# Patient Record
Sex: Female | Born: 1985 | Race: White | Hispanic: No | Marital: Married | Smoking: Never smoker
Health system: Southern US, Community
[De-identification: ages and names within clinical notes are randomized; demographics above are authoritative.]

## PROBLEM LIST (undated history)

## (undated) DIAGNOSIS — A6000 Herpesviral infection of urogenital system, unspecified: Secondary | ICD-10-CM

## (undated) DIAGNOSIS — N83209 Unspecified ovarian cyst, unspecified side: Secondary | ICD-10-CM

## (undated) DIAGNOSIS — R519 Headache, unspecified: Secondary | ICD-10-CM

## (undated) DIAGNOSIS — S42402A Unspecified fracture of lower end of left humerus, initial encounter for closed fracture: Secondary | ICD-10-CM

## (undated) DIAGNOSIS — G8929 Other chronic pain: Secondary | ICD-10-CM

## (undated) DIAGNOSIS — F329 Major depressive disorder, single episode, unspecified: Secondary | ICD-10-CM

## (undated) DIAGNOSIS — R51 Headache: Secondary | ICD-10-CM

## (undated) DIAGNOSIS — F32A Depression, unspecified: Secondary | ICD-10-CM

## (undated) HISTORY — DX: Other chronic pain: G89.29

## (undated) HISTORY — DX: Unspecified fracture of lower end of left humerus, initial encounter for closed fracture: S42.402A

## (undated) HISTORY — DX: Major depressive disorder, single episode, unspecified: F32.9

## (undated) HISTORY — DX: Unspecified ovarian cyst, unspecified side: N83.209

## (undated) HISTORY — DX: Herpesviral infection of urogenital system, unspecified: A60.00

## (undated) HISTORY — DX: Headache: R51

## (undated) HISTORY — DX: Depression, unspecified: F32.A

## (undated) HISTORY — DX: Headache, unspecified: R51.9

## (undated) HISTORY — PX: GALLBLADDER SURGERY: SHX652

---
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
## 2006-12-25 ENCOUNTER — Observation Stay: Payer: Self-pay | Admitting: Obstetrics and Gynecology

## 2006-12-25 ENCOUNTER — Inpatient Hospital Stay: Payer: Self-pay | Admitting: Obstetrics and Gynecology

## 2007-11-18 ENCOUNTER — Emergency Department: Payer: Self-pay | Admitting: Emergency Medicine

## 2010-02-12 DIAGNOSIS — A6000 Herpesviral infection of urogenital system, unspecified: Secondary | ICD-10-CM

## 2010-02-12 HISTORY — DX: Herpesviral infection of urogenital system, unspecified: A60.00

## 2010-02-12 HISTORY — PX: DIAGNOSTIC LAPAROSCOPY: SUR761

## 2011-02-23 ENCOUNTER — Ambulatory Visit: Payer: Self-pay | Admitting: Obstetrics & Gynecology

## 2011-03-22 ENCOUNTER — Ambulatory Visit: Payer: Self-pay | Admitting: Obstetrics & Gynecology

## 2011-03-22 LAB — CBC
HCT: 41.1 % (ref 35.0–47.0)
HGB: 13.7 g/dL (ref 12.0–16.0)
Platelet: 210 10*3/uL (ref 150–440)
RBC: 4.52 10*6/uL (ref 3.80–5.20)
WBC: 12.1 10*3/uL — ABNORMAL HIGH (ref 3.6–11.0)

## 2011-04-06 ENCOUNTER — Ambulatory Visit: Payer: Self-pay | Admitting: Obstetrics & Gynecology

## 2011-04-06 LAB — PREGNANCY, URINE: Pregnancy Test, Urine: NEGATIVE m[IU]/mL

## 2012-08-03 ENCOUNTER — Inpatient Hospital Stay: Payer: Self-pay | Admitting: Obstetrics and Gynecology

## 2012-08-03 LAB — CBC WITH DIFFERENTIAL/PLATELET
Eosinophil %: 1 %
HCT: 35.5 % (ref 35.0–47.0)
HGB: 12.1 g/dL (ref 12.0–16.0)
Lymphocyte #: 0.9 10*3/uL — ABNORMAL LOW (ref 1.0–3.6)
Lymphocyte %: 7.5 %
MCH: 30.6 pg (ref 26.0–34.0)
MCHC: 34 g/dL (ref 32.0–36.0)
Monocyte #: 0.8 x10 3/mm (ref 0.2–0.9)
Monocyte %: 6.8 %
Neutrophil %: 84.7 %
Platelet: 182 10*3/uL (ref 150–440)
RBC: 3.95 10*6/uL (ref 3.80–5.20)
RDW: 13.1 % (ref 11.5–14.5)
WBC: 12.2 10*3/uL — ABNORMAL HIGH (ref 3.6–11.0)

## 2014-06-06 NOTE — Op Note (Signed)
PATIENT NAME:  Judy HammersmithMASHBURN, [name] MR#:  [id number] DATE OF BIRTH:  1985-06-10  DATE OF PROCEDURE:  04/06/2011  PREOPERATIVE DIAGNOSIS:  Pelvic pain and complication arising from IUD placement.  POSTOPERATIVE DIAGNOSIS:  Pelvic pain and complication arising from IUD placement.  PROCEDURE: Laparoscopy with removal of IUD.   SURGEON: Annamarie MajorPaul Yazmeen Woolf, M.D.   ANESTHESIA: General.   ESTIMATED BLOOD LOSS: Minimal.   COMPLICATIONS: None.   FINDINGS: Normal pelvic anatomy. IUD was located in the fat surrounding the lower sigmoid.   DISPOSITION: To the recovery room in stable condition.   TECHNIQUE: The patient is prepped and draped in the usual sterile fashion after adequate anesthesia is obtained in the dorsal lithotomy position. Bladder is drained with a Robinson catheter and a Hulka tenaculum is placed on the uterus for manipulation purposes.   Attention is then turned to the abdomen where a Veress needle is inserted through a 5-mm infraumbilical incision after Marcaine is used to anesthetize the skin. Veress needle placement is confirmed using the hanging drop technique and the abdomen is then insufflated with CO2 gas. A 5-mm trocar is then inserted under direct visualization with the laparoscope with no injuries or bleeding noted. The patient is placed in Trendelenburg positioning. The right lower quadrant incision is made with a scalpel after Marcaine is used to anesthetize the skin. A 5-mm trocar is placed with no injuries or bleeding noted. Laparoscopic instrumentation is used until the IUD is located in the tissue surrounding the lower sigmoid colon. There was no perforation in the bowel or colon or into the sidewall near the ureter. The IUD is grasped with a grasping instrument laparoscopically and is easily tented upwards until it was freed from the tissues. There was no bleeding noted. The IUD is removed completely. Gas is expelled. Trocars are removed. The skin is closed with Dermabond.  Hulka tenaculum and the catheter are also removed. The patient goes to the recovery room in stable condition. All sponge, instrument, and needle counts are correct.     ____________________________ R. Annamarie MajorPaul Alexzavier Girardin, MD rph:bjt D: 04/06/2011 14:34:14 ET T: 04/06/2011 16:03:15 ET JOB#: [id number]  cc: Dierdre Searles. Paul Hoover Grewe, MD, <Dictator> Nadara MustardOBERT P Remy Dia MD ELECTRONICALLY SIGNED 04/06/2011 [id number]:[id number]

## 2014-09-13 DIAGNOSIS — S42402A Unspecified fracture of lower end of left humerus, initial encounter for closed fracture: Secondary | ICD-10-CM

## 2014-09-13 HISTORY — DX: Unspecified fracture of lower end of left humerus, initial encounter for closed fracture: S42.402A

## 2014-10-31 ENCOUNTER — Emergency Department
Admission: EM | Admit: 2014-10-31 | Discharge: 2014-10-31 | Disposition: A | Discharge status: Home / Self Care | Payer: Self-pay | Attending: Emergency Medicine | Admitting: Emergency Medicine

## 2014-10-31 ENCOUNTER — Emergency Department: Payer: Self-pay

## 2014-10-31 ENCOUNTER — Encounter: Payer: Self-pay | Admitting: Emergency Medicine

## 2014-10-31 DIAGNOSIS — Y9389 Activity, other specified: Secondary | ICD-10-CM | POA: Insufficient documentation

## 2014-10-31 DIAGNOSIS — S42432A Displaced fracture (avulsion) of lateral epicondyle of left humerus, initial encounter for closed fracture: Secondary | ICD-10-CM | POA: Insufficient documentation

## 2014-10-31 DIAGNOSIS — W1839XA Other fall on same level, initial encounter: Secondary | ICD-10-CM | POA: Insufficient documentation

## 2014-10-31 DIAGNOSIS — Y998 Other external cause status: Secondary | ICD-10-CM | POA: Insufficient documentation

## 2014-10-31 DIAGNOSIS — S42433A Displaced fracture (avulsion) of lateral epicondyle of unspecified humerus, initial encounter for closed fracture: Secondary | ICD-10-CM

## 2014-10-31 DIAGNOSIS — Y9289 Other specified places as the place of occurrence of the external cause: Secondary | ICD-10-CM | POA: Insufficient documentation

## 2014-10-31 MED ORDER — NAPROXEN 500 MG PO TABS: 500.0000 mg | ORAL_TABLET | Freq: Two times a day (BID) | ORAL | Status: DC

## 2014-10-31 MED ORDER — HYDROCODONE-ACETAMINOPHEN 5-325 MG PO TABS: 1.0000 | ORAL_TABLET | ORAL | Status: DC | PRN

## 2014-10-31 NOTE — ED Notes (Signed)
Fell on Friday pm  Injury to left elbow

## 2014-10-31 NOTE — ED Provider Notes (Signed)
[facility] Department Provider Note ____________________________________________  Time seen: Approximately 9:03 AM  I have reviewed the triage vital signs and the nursing notes.   HISTORY  Chief Complaint Fall   HPI Judy Lozano is a 29 y.o. female here complaining of left elbow pain. Patient states she fell Friday night while "playing around". She denies any head injury or loss of consciousness. She states that at the time she hit her elbow falling back that thought that it would continue to get better. Since that time it is swollen more and become more painful. Today she has decreased range of motion secondary to increased pain. She states the pain is improved if holding it still. She has not taken any over-the-counter medication for her pain. She rates her pain a 6 out of 10. She denies any previous injuries to her elbow.   History reviewed. No pertinent past medical history.  There are no active problems to display for this patient.   History reviewed. No pertinent past surgical history.  Current Outpatient Rx  Name  Route  Sig  Dispense  Refill  . HYDROcodone-acetaminophen (NORCO/VICODIN) 5-325 MG per tablet   Oral   Take 1 tablet by mouth every 4 (four) hours as needed for moderate pain.   20 tablet   0   . naproxen (NAPROSYN) 500 MG tablet   Oral   Take 1 tablet (500 mg total) by mouth 2 (two) times daily with a meal.   30 tablet   0     Allergies Review of patient's allergies indicates no known allergies.  No family history on file.  Social History Social History  Substance Use Topics  . Smoking status: Never Smoker   . Smokeless tobacco: None  . Alcohol Use: Yes    Review of Systems Constitutional: No fever/chills Eyes: No visual changes. ENT: No sore throat. Cardiovascular: Denies chest pain. Respiratory: Denies shortness of breath. Gastrointestinal:   No nausea, no vomiting.   Musculoskeletal: Negative for  back pain. Positive for left elbow pain Skin: Negative for rash. Neurological: Negative for headaches, focal weakness or numbness.  10-point ROS otherwise negative.  ____________________________________________   PHYSICAL EXAM:  VITAL SIGNS: ED Triage Vitals  Enc Vitals Group     BP --      Pulse --      Resp --      Temp --      Temp src --      SpO2 --      Weight --      Height --      Head Cir --      Peak Flow --      Pain Score --      Pain Loc --      Pain Edu? --      Excl. in GC? --     Constitutional: Alert and oriented. Well appearing and in no acute distress. Eyes: Conjunctivae are normal. PERRL. EOMI. Head: Atraumatic. Nose: No congestion/rhinnorhea. Neck: No stridor.   Cardiovascular: Normal rate, regular rhythm. Grossly normal heart sounds.  Good peripheral circulation. Respiratory: Normal respiratory effort.  No retractions. Lungs CTAB. Gastrointestinal: Soft and nontender. No distention. Musculoskeletal: Left elbow no gross deformity however there is moderate soft tissue edema present. Range of motion is restricted secondary to pain. Patient is limited in rotation and tension. No lower extremity tenderness nor edema.  No joint effusions. Neurologic:  Normal speech and language. No gross focal neurologic deficits are  appreciated. No gait instability. Skin:  Skin is warm, dry and intact. No rash noted. No ecchymosis or abrasions were noted on the elbow. Psychiatric: Mood and affect are normal. Speech and behavior are normal.  ____________________________________________   LABS (all labs ordered are listed, but only abnormal results are displayed)  Labs Reviewed - No data to display _ RADIOLOGY  X-ray of the left elbow per radiologist shows moderate joint effusion with positive posterior fat pad. There is a tiny avulsion fracture adjacent to the lateral humeral epicondyle. ____________________________________________   PROCEDURES  Procedure(s)  performed: None  Critical Care performed: No  ____________________________________________   INITIAL IMPRESSION / ASSESSMENT AND PLAN / ED COURSE  Pertinent labs & imaging results that were available during my care of the patient were reviewed by me and considered in my medical decision making (see chart for details).  Patient was informed of the results of her x-ray. She was placed in a posterior OCL splint and sling. She was given naproxen for inflammation and Norco as needed for pain. She is to ice and elevate to reduce swelling. She is to follow-up with Dr. Ernest Pine who is on-call for orthopedics. ____________________________________________   FINAL CLINICAL IMPRESSION(S) / ED DIAGNOSES  Final diagnoses:  Avulsion fracture of lateral epicondyle of humerus      Tommi Rumps, PA-C 10/31/14 1055  Darien Ramus, MD 10/31/14 [phone]

## 2014-11-28 ENCOUNTER — Emergency Department
Admission: EM | Admit: 2014-11-28 | Discharge: 2014-11-28 | Disposition: A | Discharge status: Home / Self Care | Payer: Self-pay | Attending: Emergency Medicine | Admitting: Emergency Medicine

## 2014-11-28 ENCOUNTER — Encounter: Payer: Self-pay | Admitting: Emergency Medicine

## 2014-11-28 DIAGNOSIS — Y9289 Other specified places as the place of occurrence of the external cause: Secondary | ICD-10-CM | POA: Insufficient documentation

## 2014-11-28 DIAGNOSIS — Z791 Long term (current) use of non-steroidal anti-inflammatories (NSAID): Secondary | ICD-10-CM | POA: Insufficient documentation

## 2014-11-28 DIAGNOSIS — Y998 Other external cause status: Secondary | ICD-10-CM | POA: Insufficient documentation

## 2014-11-28 DIAGNOSIS — Y9389 Activity, other specified: Secondary | ICD-10-CM | POA: Insufficient documentation

## 2014-11-28 DIAGNOSIS — Z23 Encounter for immunization: Secondary | ICD-10-CM | POA: Insufficient documentation

## 2014-11-28 DIAGNOSIS — S91312A Laceration without foreign body, left foot, initial encounter: Secondary | ICD-10-CM

## 2014-11-28 DIAGNOSIS — W2209XA Striking against other stationary object, initial encounter: Secondary | ICD-10-CM | POA: Insufficient documentation

## 2014-11-28 MED ORDER — LIDOCAINE-EPINEPHRINE (PF) 1 %-1:200000 IJ SOLN
INTRAMUSCULAR | Status: AC
  Administered 2014-11-28: 30 mL
  Filled 2014-11-28: qty 30

## 2014-11-28 MED ORDER — LIDOCAINE-EPINEPHRINE 2 %-1:100000 IJ SOLN
30.0000 mL | Freq: Once | INTRAMUSCULAR | Status: DC
  Filled 2014-11-28: qty 30

## 2014-11-28 MED ORDER — CEPHALEXIN 500 MG PO CAPS: 500.0000 mg | ORAL_CAPSULE | Freq: Two times a day (BID) | ORAL | Status: DC

## 2014-11-28 MED ORDER — TETANUS-DIPHTHERIA TOXOIDS TD 5-2 LFU IM INJ
0.5000 mL | INJECTION | Freq: Once | INTRAMUSCULAR | Status: AC
  Administered 2014-11-28: 0.5 mL via INTRAMUSCULAR
  Filled 2014-11-28: qty 0.5

## 2014-11-28 NOTE — ED Notes (Signed)
Pt has laceration on her left heal from hitting it with screen door.

## 2014-11-28 NOTE — Discharge Instructions (Signed)
Laceration Care, Adult  A laceration is a cut that goes through all layers of the skin. The cut also goes into the tissue that is right under the skin. Some cuts heal on their own. Others need to be closed with stitches (sutures), staples, skin adhesive strips, or wound glue. Taking care of your cut lowers your risk of infection and helps your cut to heal better.  HOW TO TAKE CARE OF YOUR CUT  For stitches or staples:  · Keep the wound clean and dry.  · If you were given a bandage (dressing), you should change it at least one time per day or as told by your doctor. You should also change it if it gets wet or dirty.  · Keep the wound completely dry for the first 24 hours or as told by your doctor. After that time, you may take a shower or a bath. However, make sure that the wound is not soaked in water until after the stitches or staples have been removed.  · Clean the wound one time each day or as told by your doctor:    Wash the wound with soap and water.    Rinse the wound with water until all of the soap comes off.    Pat the wound dry with a clean towel. Do not rub the wound.  · After you clean the wound, put a thin layer of antibiotic ointment on it as told by your doctor. This ointment:    Helps to prevent infection.    Keeps the bandage from sticking to the wound.  · Have your stitches or staples removed as told by your doctor.  If your doctor used skin adhesive strips:   · Keep the wound clean and dry.  · If you were given a bandage, you should change it at least one time per day or as told by your doctor. You should also change it if it gets dirty or wet.  · Do not get the skin adhesive strips wet. You can take a shower or a bath, but be careful to keep the wound dry.  · If the wound gets wet, pat it dry with a clean towel. Do not rub the wound.  · Skin adhesive strips fall off on their own. You can trim the strips as the wound heals. Do not remove any strips that are still stuck to the wound. They will  fall off after a while.  If your doctor used wound glue:  · Try to keep your wound dry, but you may briefly wet it in the shower or bath. Do not soak the wound in water, such as by swimming.  · After you take a shower or a bath, gently pat the wound dry with a clean towel. Do not rub the wound.  · Do not do any activities that will make you really sweaty until the skin glue has fallen off on its own.  · Do not apply liquid, cream, or ointment medicine to your wound while the skin glue is still on.  · If you were given a bandage, you should change it at least one time per day or as told by your doctor. You should also change it if it gets dirty or wet.  · If a bandage is placed over the wound, do not let the tape for the bandage touch the skin glue.  · Do not pick at the glue. The skin glue usually stays on for 5-10 days. Then, it   falls off of the skin.  General Instructions   · To help prevent scarring, make sure to cover your wound with sunscreen whenever you are outside after stitches are removed, after adhesive strips are removed, or when wound glue stays in place and the wound is healed. Make sure to wear a sunscreen of at least 30 SPF.  · Take over-the-counter and prescription medicines only as told by your doctor.  · If you were given antibiotic medicine or ointment, take or apply it as told by your doctor. Do not stop using the antibiotic even if your wound is getting better.  · Do not scratch or pick at the wound.  · Keep all follow-up visits as told by your doctor. This is important.  · Check your wound every day for signs of infection. Watch for:    Redness, swelling, or pain.    Fluid, blood, or pus.  · Raise (elevate) the injured area above the level of your heart while you are sitting or lying down, if possible.  GET HELP IF:  · You got a tetanus shot and you have any of these problems at the injection site:    Swelling.    Very bad pain.    Redness.    Bleeding.  · You have a fever.  · A wound that was  closed breaks open.  · You notice a bad smell coming from your wound or your bandage.  · You notice something coming out of the wound, such as wood or glass.  · Medicine does not help your pain.  · You have more redness, swelling, or pain at the site of your wound.  · You have fluid, blood, or pus coming from your wound.  · You notice a change in the color of your skin near your wound.  · You need to change the bandage often because fluid, blood, or pus is coming from the wound.  · You start to have a new rash.  · You start to have numbness around the wound.  GET HELP RIGHT AWAY IF:  · You have very bad swelling around the wound.  · Your pain suddenly gets worse and is very bad.  · You notice painful lumps near the wound or on skin that is anywhere on your body.  · You have a red streak going away from your wound.  · The wound is on your hand or foot and you cannot move a finger or toe like you usually can.  · The wound is on your hand or foot and you notice that your fingers or toes look pale or bluish.     This information is not intended to replace advice given to you by your health care provider. Make sure you discuss any questions you have with your health care provider.     Document Released: 07/18/2007 Document Revised: 06/15/2014 Document Reviewed: 01/25/2014  Elsevier Interactive Patient Education ©2016 [employer]

## 2014-11-28 NOTE — ED Provider Notes (Signed)
[facility] Emergency Department Provider Note  ____________________________________________  Time seen: On arrival  I have reviewed the triage vital signs and the nursing notes.   HISTORY  Chief Complaint Laceration    HPI KEYLIN FERRYMAN is a 29 y.o. female who presents with complaints of a laceration to the left heel. She notes that approximately 13 hours ago screen door closed on her heel and she suffered a laceration. She remains able to move her foot including flexion and extension. She not come last night because she did not think it was that bad.    History reviewed. No pertinent past medical history.  There are no active problems to display for this patient.   Past Surgical History  Procedure Laterality Date  . Gallbladder surgery      Current Outpatient Rx  Name  Route  Sig  Dispense  Refill  . cephALEXin (KEFLEX) 500 MG capsule   Oral   Take 1 capsule (500 mg total) by mouth 2 (two) times daily.   14 capsule   0   . HYDROcodone-acetaminophen (NORCO/VICODIN) 5-325 MG per tablet   Oral   Take 1 tablet by mouth every 4 (four) hours as needed for moderate pain.   20 tablet   0   . naproxen (NAPROSYN) 500 MG tablet   Oral   Take 1 tablet (500 mg total) by mouth 2 (two) times daily with a meal.   30 tablet   0     Allergies Review of patient's allergies indicates no known allergies.  No family history on file.  Social History Social History  Substance Use Topics  . Smoking status: Never Smoker   . Smokeless tobacco: None  . Alcohol Use: Yes     Comment: Occasional    Review of Systems  Constitutional: Negative for fever.   Genitourinary: Negative for dysuria. Musculoskeletal: Negative for back pain. Skin: Negative for rash. Positive for laceration Neurological: Negative for headaches or focal weakness   ____________________________________________   PHYSICAL EXAM:  VITAL SIGNS: ED Triage Vitals  Enc  Vitals Group     BP 11/28/14 1118 139/63 mmHg     Pulse Rate 11/28/14 1118 82     Resp --      Temp 11/28/14 1118 98.3 F (36.8 C)     Temp Source 11/28/14 1118 Oral     SpO2 11/28/14 1118 99 %     Weight 11/28/14 1118 160 lb (72.576 kg)     Height 11/28/14 1118  (1.651 m)     Head Cir --      Peak Flow --      Pain Score 11/28/14 1124 6     Pain Loc --      Pain Edu? --      Excl. in GC? --      Constitutional: Alert and oriented. Well appearing and in no distress. Eyes: Conjunctivae are normal.  ENT   Head: Normocephalic and atraumatic.   Mouth/Throat: Mucous membranes are moist. Cardiovascular: Normal rate, regular rhythm.  Respiratory: Normal respiratory effort without tachypnea nor retractions.  Gastrointestinal: Soft and non-tender in all quadrants. No distention. There is no CVA tenderness. Musculoskeletal: Nontender with normal range of motion in all extremities. Achilles tendon function is normal, no evidence of laceration Neurologic:  Normal speech and language. No gross focal neurologic deficits are appreciated. Skin:  Skin is warm, dry and intact. No rash noted. Curvilinear laceration noted to the posterior left heel overlying the Achilles  tendon. Tendon is not visible and function appears to be normal. Patient has normal blood flow to the lower extremity. Psychiatric: Mood and affect are normal. Patient exhibits appropriate insight and judgment.  ____________________________________________    LABS (pertinent positives/negatives)  Labs Reviewed - No data to display  ____________________________________________     ____________________________________________    RADIOLOGY I have personally reviewed any xrays that were ordered on this patient: None  ____________________________________________   PROCEDURES  Procedure(s) performed: yes  LACERATION REPAIR Performed by: Jene EveryKINNER, Emmalynne Courtney Authorized by: Jene EveryKINNER, Bladen Umar Consent: Verbal consent  obtained. Risks and benefits: risks, benefits and alternatives were discussed Consent given by: patient Patient identity confirmed: provided demographic data Prepped and Draped in normal sterile fashion Wound explored  Laceration Location: Left heel  Laceration Length: 7 cm  No Foreign Bodies seen or palpated  Anesthesia: local infiltration  Local anesthetic: lidocaine 1% with epinephrine  Anesthetic total: 4 ml  Irrigation method: syringe Amount of cleaning: Extensive   Skin closure: Loose approximation   Number of sutures: 3   Technique: Simple interrupted   Patient tolerance: Patient tolerated the procedure well with no immediate complications.    ____________________________________________   INITIAL IMPRESSION / ASSESSMENT AND PLAN / ED COURSE  Pertinent labs & imaging results that were available during my care of the patient were reviewed by me and considered in my medical decision making (see chart for details).  Given the delay in time when injury and repair I opted to loosely approximate the wound given the risk of infection. I described this to the patient and her husband. We gave a tetanus shot as well. I will prescribe prophylactic antibiotics given the location. Extensive examination of Achilles tendon function is normal.  ____________________________________________   FINAL CLINICAL IMPRESSION(S) / ED DIAGNOSES  Final diagnoses:  Laceration of heel without complication, left, initial encounter     Jene Everyobert Shimika Ames, MD 11/28/14 1249

## 2014-11-28 NOTE — ED Notes (Signed)
Laceration to left heel.  Sutures in place. Minimal pain since patient still numb from MD using lidocaine.

## 2015-09-21 ENCOUNTER — Encounter: Payer: Self-pay | Admitting: Family

## 2015-09-21 ENCOUNTER — Ambulatory Visit (INDEPENDENT_AMBULATORY_CARE_PROVIDER_SITE_OTHER): Payer: 59 | Admitting: Family

## 2015-09-21 VITALS — BP 124/84 | HR 73 | Temp 97.7°F | Ht 65.0 in | Wt 201.2 lb

## 2015-09-21 DIAGNOSIS — E663 Overweight: Secondary | ICD-10-CM

## 2015-09-21 DIAGNOSIS — F329 Major depressive disorder, single episode, unspecified: Secondary | ICD-10-CM

## 2015-09-21 DIAGNOSIS — Z0000 Encounter for general adult medical examination without abnormal findings: Secondary | ICD-10-CM | POA: Insufficient documentation

## 2015-09-21 DIAGNOSIS — F32A Depression, unspecified: Secondary | ICD-10-CM | POA: Insufficient documentation

## 2015-09-21 LAB — COMPREHENSIVE METABOLIC PANEL
ALK PHOS: 39 U/L (ref 39–117)
ALT: 18 U/L (ref 0–35)
AST: 17 U/L (ref 0–37)
Albumin: 4.1 g/dL (ref 3.5–5.2)
BILIRUBIN TOTAL: 0.3 mg/dL (ref 0.2–1.2)
BUN: 13 mg/dL (ref 6–23)
CALCIUM: 9.3 mg/dL (ref 8.4–10.5)
CO2: 25 meq/L (ref 19–32)
CREATININE: 0.68 mg/dL (ref 0.40–1.20)
Chloride: 106 mEq/L (ref 96–112)
GFR: 108.54 mL/min (ref >60.00)
GLUCOSE: 89 mg/dL (ref 70–99)
Potassium: 4.1 mEq/L (ref 3.5–5.1)
Sodium: 137 mEq/L (ref 135–145)
TOTAL PROTEIN: 7 g/dL (ref 6.0–8.3)

## 2015-09-21 LAB — CBC WITH DIFFERENTIAL/PLATELET
BASOS ABS: 0 10*3/uL (ref 0.0–0.1)
Basophils Relative: 0.4 % (ref 0.0–3.0)
EOS ABS: 0.1 10*3/uL (ref 0.0–0.7)
Eosinophils Relative: 2.2 % (ref 0.0–5.0)
HEMATOCRIT: 38.6 % (ref 36.0–46.0)
Hemoglobin: 13.1 g/dL (ref 12.0–15.0)
LYMPHS PCT: 18.4 % (ref 12.0–46.0)
Lymphs Abs: 1.2 10*3/uL (ref 0.7–4.0)
MCHC: 34 g/dL (ref 30.0–36.0)
MCV: 87.6 fl (ref 78.0–100.0)
MONOS PCT: 8.8 % (ref 3.0–12.0)
Monocytes Absolute: 0.6 10*3/uL (ref 0.1–1.0)
NEUTROS ABS: 4.6 10*3/uL (ref 1.4–7.7)
NEUTROS PCT: 70.2 % (ref 43.0–77.0)
PLATELETS: 245 10*3/uL (ref 150.0–400.0)
RBC: 4.4 Mil/uL (ref 3.87–5.11)
RDW: 12.9 % (ref 11.5–15.5)
WBC: 6.6 10*3/uL (ref 4.0–10.5)

## 2015-09-21 LAB — LIPID PANEL
CHOL/HDL RATIO: 2
Cholesterol: 167 mg/dL (ref 0–200)
HDL: 72.3 mg/dL (ref >39.00)
LDL CALC: 77 mg/dL (ref 0–99)
NonHDL: 95.14
Triglycerides: 93 mg/dL (ref 0.0–149.0)
VLDL: 18.6 mg/dL (ref 0.0–40.0)

## 2015-09-21 LAB — HEMOGLOBIN A1C: Hgb A1c MFr Bld: 5 % (ref 4.6–6.5)

## 2015-09-21 LAB — TSH: TSH: 3.25 u[IU]/mL (ref 0.35–4.50)

## 2015-09-21 LAB — VITAMIN D 25 HYDROXY (VIT D DEFICIENCY, FRACTURES): VITD: 43.35 ng/mL (ref 30.00–100.00)

## 2015-09-21 NOTE — Progress Notes (Signed)
Pre visit review using our clinic review tool, if applicable. No additional management support is needed unless otherwise documented below in the visit note. 

## 2015-09-21 NOTE — Progress Notes (Signed)
Subjective:    Patient ID: Judy Lozano, female    DOB: 1985-11-20, 30 y.o.   MRN: [id number]  CC: Judy Lozano is a 30 y.o. female who presents today for physical exam.    HPI: Patient here to establish care, routine physical.  Depression: Diagnosed 4-5 years ago after first husband left. Resurfaced after second marriage and baby. No medication at this time. Feeling better now. Stable.No thoughts of hurting herself or anyone else.   Weight gain: Gained weight after broke left elbow last year; frustrated and has cut back on sweets and fried foods. Fluctuating loosing and gaining.          Cervical Cancer Screening: No h/o abnormal. Last pap 2015.  Still follows with OB GYN and will do Pap with him this year.   Immunizations       Tetanus - UTD HIV Screening- Candidate for; declines. Labs: Screening labs today. Exercise: None right now.  Alcohol use: occasional.  Smoking/tobacco use: Nonsmoker.  Regular dental exams: In need of dental exam. Wears seat belt: Yes.  HISTORY:  Past Medical History:  Diagnosis Date  . Chronic headaches   . Depression   . Left elbow fracture 09/2014    Past Surgical History:  Procedure Laterality Date  . GALLBLADDER SURGERY     Family History  Problem Relation Age of Onset  . Depression Mother   . Heart disease Father   . Diabetes Father   . Diabetes Maternal Aunt   . Cancer Maternal Aunt 20    breast  . Cancer Maternal Grandfather   . Diabetes Paternal Grandmother   . Cancer Maternal Aunt 50    breast      ALLERGIES: Review of patient's allergies indicates no known allergies.  No current outpatient prescriptions on file prior to visit.   No current facility-administered medications on file prior to visit.     Social History  Substance Use Topics  . Smoking status: Never Smoker  . Smokeless tobacco: Never Used  . Alcohol use Yes     Comment: Occasional    Review of Systems  Constitutional: Negative for  chills, fever and unexpected weight change.  HENT: Negative for congestion.   Respiratory: Negative for cough.   Cardiovascular: Negative for chest pain, palpitations and leg swelling.  Gastrointestinal: Negative for nausea and vomiting.  Musculoskeletal: Negative for arthralgias and myalgias.  Skin: Negative for rash.  Neurological: Negative for headaches.  Hematological: Negative for adenopathy.  Psychiatric/Behavioral: Negative for confusion and suicidal ideas.      Objective:    BP 124/84 (BP Location: Left Arm, Patient Position: Sitting, Cuff Size: Large)   Pulse 73   Temp 97.7 F (36.5 C) (Oral)   Ht  (1.651 m)   Wt 201 lb 3.2 oz (91.3 kg)   SpO2 100%   BMI 33.48 kg/m   BP Readings from Last 3 Encounters:  09/21/15 124/84  11/28/14 139/63  10/31/14 (!) 147/73   Wt Readings from Last 3 Encounters:  09/21/15 201 lb 3.2 oz (91.3 kg)  11/28/14 160 lb (72.6 kg)  10/31/14 160 lb (72.6 kg)    Physical Exam  Constitutional: She is oriented to person, place, and time. She appears well-developed and well-nourished.  Eyes: Conjunctivae are normal.  Neck: No thyroid mass and no thyromegaly present.  Cardiovascular: Normal rate, regular rhythm, normal heart sounds and normal pulses.   Pulmonary/Chest: Effort normal and breath sounds normal. She has no wheezes. She has no rhonchi.  She has no rales. Right breast exhibits no inverted nipple, no mass, no nipple discharge, no skin change and no tenderness. Left breast exhibits no inverted nipple, no mass, no nipple discharge, no skin change and no tenderness. Breasts are symmetrical.  Neurological: She is alert and oriented to person, place, and time.  Skin: Skin is warm and dry.  Psychiatric: She has a normal mood and affect. Her speech is normal and behavior is normal. Thought content normal.  Vitals reviewed.      Assessment & Plan:   Problem List Items Addressed This Visit      Other   Depression    Stable. Will  follow.      Routine general medical examination at a health care facility - Primary    Follows with OB/GYN and will do Pap smear there this year. Tetanus is up-to-date. Declines HIV screening. We will do other screening labs today. Patient is a mother of 3 children and working, I encouraged her to find 10-15 minutes, a couple times a week to squeeze in some exercise. Also encouraged her to look into Weight Watchers for additional help for loosing weight.      Relevant Orders   CBC with Differential/Platelet   Comprehensive metabolic panel   Hemoglobin A1c   Lipid panel   TSH   VITAMIN D 25 Hydroxy (Vit-D Deficiency, Fractures)    Other Visit Diagnoses   None.      I have discontinued Ms. Stolarz's HYDROcodone-acetaminophen, naproxen, and cephALEXin.   No orders of the defined types were placed in this encounter.   Return precautions given.   Risks, benefits, and alternatives of the medications and treatment plan prescribed today were discussed, and patient expressed understanding.   Education regarding symptom management and diagnosis given to patient on AVS.   Continue to follow with Rennie PlowmanMargaret Asyria Kolander, FNP for routine health maintenance.   Torrie Mayersatherine R Meuser and I agreed with plan.   Rennie PlowmanMargaret Nico Syme, FNP

## 2015-09-21 NOTE — Assessment & Plan Note (Signed)
Follows with OB/GYN and will do Pap smear there this year. Tetanus is up-to-date. Declines HIV screening. We will do other screening labs today. Patient is a mother of 3 children and working, I encouraged her to find 10-15 minutes, a couple times a week to squeeze in some exercise. Also encouraged her to look into Weight Wat[employer] for additional help for loosing weight.

## 2015-09-21 NOTE — Assessment & Plan Note (Signed)
Stable. Will follow.  

## 2015-09-21 NOTE — Patient Instructions (Addendum)
Pleasure meeting you.  Health Maintenance, Female Adopting a healthy lifestyle and getting preventive care can go a long way to promote health and wellness. Talk with your health care provider about what schedule of regular examinations is right for you. This is a good chance for you to check in with your provider about disease prevention and staying healthy.  In between checkups, there are plenty of things you can do on your own. Experts have done a lot of research about which lifestyle changes and p     d or cholesterol levels are high.  You are older than 29 years of age.  You are at high risk for heart disease.  CANCER SCREENING   Lung Cancer  Lung cancer screening is recommended for adults 67-31 years old who are at high risk for lung cancer because of a history of smoking.  A yearly low-dose CT scan of the lungs is recommended for people who:  Currently smoke.  Have quit within the past 15 years.  Have at least a 30-pack-year history of smoking. A pack year is smoking an average of one pack of cigarettes a day for 1 year.  Yearly screening should continue until it has been 15 years since you quit.  Yearly screening should stop if you develop a health problem that would prevent you from having lung cancer treatment.  Breast Cancer  Practice breast self-awareness. This means understanding how your breasts normally appear and feel.  It also means doing regular breast self-exams. Let your health care provider know about any changes, no matter how small.  If you are in your 20s or 30s, you should have a clinical breast exam (CBE) by a health care provider every 1-3 years as part of a regular health exam.  If you are 49 or older, have a CBE every year. Also consider having a breast X-ray (mammogram) every year.  If you have a family history of breast cancer, talk to your health care provider about genetic screening.  If you are at high risk for breast cancer, talk to your  health care provider about having an MRI and a mammogram every year.  Breast cancer gene (BRCA) assessment is recommended for women who have family members with BRCA-related cancers. BRCA-related cancers include:  Breast.  Ovarian.  Tubal.  Peritoneal cancers.  Results of the assessment will determine the need for genetic counseling and BRCA1 and BRCA2 testing. Cervical Cancer Your health care provider may recommend that you be screened regularly for cancer of the pelvic organs (ovaries, uterus, and vagina). This screening involves a pelvic examination, including checking for microscopic changes to the surface of your cervix (Pap test). You may be encouraged to have this screening done every 3 years, beginning at age 16.  For women ages 1-65, health care providers may recommend pelvic exams and Pap testing every 3 years, or they may recommend the Pap and pelvic exam, combined with testing for human papilloma virus (HPV), every 5 years. Some types of HPV increase your risk of cervical cancer. Testing for HPV may also be done on women of any age with unclear Pap test results.  Other health care providers may not recommend any screening for nonpregnant women who are considered low risk for pelvic cancer and who do not have symptoms. Ask your health care provider if a screening pelvic exam is right for you.  If you have had past treatment for cervical cancer or a condition that could lead to cancer, you need Pap  tests and screening for cancer for at least 20 years after your treatment. If Pap tests have been discontinued, your risk factors (such as having a new sexual partner) need to be reassessed to determine if screening should resume. Some women have medical problems that increase the chance of getting cervical cancer. In these cases, your health care provider may recommend more frequent screening and Pap tests. Colorectal Cancer  This type of cancer can be detected and often  prevented.  Routine colorectal cancer screening usually begins at 29 years of age and continues through 29 years of age.  Your health care provider may recommend screening at an earlier age if you have risk factors for colon cancer.  Your health care provider may also recommend using home test kits to check for hidden blood in the stool.  A small camera at the end of a tube can be used to examine your colon directly (sigmoidoscopy or colonoscopy). This is done to check for the earliest forms of colorectal cancer.  Routine screening usually begins at age 35.  Direct examination of the colon should be repeated every 5-10 years through 29 years of age. However, you may need to be screened more often if early forms of precancerous polyps or small growths are found. Skin Cancer  Check your skin from head to toe regularly.  Tell your health care provider about any new moles or changes in moles, especially if there is a change in a mole's shape or color.  Also tell your health care provider if you have a mole that is larger than the size of a pencil eraser.  Always use sunscreen. Apply sunscreen liberally and repeatedly throughout the day.  Protect yourself by wearing long sleeves, pants, a wide-brimmed hat, and sunglasses whenever you are outside. HEART DISEASE, DIABETES, AND HIGH BLOOD PRESSURE   High blood pressure causes heart disease and increases the risk of stroke. High blood pressure is more likely to develop in:  People who have blood pressure in the high end of the normal range (130-139/85-89 mm Hg).  People who are overweight or obese.  People who are African American.  If you are 6-42 years of age, have your blood pressure checked every 3-5 years. If you are 57 years of age or older, have your blood pressure checked every year. You should have your blood pressure measured twice--once when you are at a hospital or clinic, and once when you are not at a hospital or clinic.  Record the average of the two measurements. To check your blood pressure when you are not at a hospital or clinic, you can use:  An automated blood pressure machine at a pharmacy.  A home blood pressure monitor.  If you are between 20 years and 79 years old, ask your health care provider if you should take aspirin to prevent strokes.  Have regular diabetes screenings. This involves taking a blood sample to check your fasting blood sugar level.  If you are at a normal weight and have a low risk for diabetes, have this test once every three years after 29 years of age.  If you are overweight and have a high risk for diabetes, consider being tested at a younger age or more often. PREVENTING INFECTION  Hepatitis B  If you have a higher risk for hepatitis B, you should be screened for this virus. You are considered at high risk for hepatitis B if:  You were born in a country where hepatitis B is common.  Ask your health care provider which countries are considered high risk.  Your parents were born in a high-risk country, and you have not been immunized against hepatitis B (hepatitis B vaccine).  You have HIV or AIDS.  You use needles to inject street drugs.  You live with someone who has hepatitis B.  You have had sex with someone who has hepatitis B.  You get hemodialysis treatment.  You take certain medicines for conditions, including cancer, organ transplantation, and autoimmune conditions. Hepatitis C  Blood testing is recommended for:  Everyone born from [birth year] through 1964.  Anyone with known risk factors for hepatitis C. Sexually transmitted infections (STIs)  You should be screened for sexually transmitted infections (STIs) including gonorrhea and chlamydia if:  You are sexually active and are younger than 29 years of age.  You are older than 29 years of age and your health care provider tells you that you are at risk for this type of infection.  Your sexual activity  has changed since you were last screened and you are at an increased risk for chlamydia or gonorrhea. Ask your health care provider if you are at risk.  If you do not have HIV, but are at risk, it may be recommended that you take a prescription medicine daily to prevent HIV infection. This is called pre-exposure prophylaxis (PrEP). You are considered at risk if:  You are sexually active and do not regularly use condoms or know the HIV status of your partner(s).  You take drugs by injection.  You are sexually active with a partner who has HIV. Talk with your health care provider about whether you are at high risk of being infected with HIV. If you choose to begin PrEP, you should first be tested for HIV. You should then be tested every 3 months for as long as you are taking PrEP.  PREGNANCY   If you are premenopausal and you may become pregnant, ask your health care provider about preconception counseling.  If you may become pregnant, take 400 to 800 micrograms (mcg) of folic acid every day.  If you want to prevent pregnancy, talk to your health care provider about birth control (contraception). OSTEOPOROSIS AND MENOPAUSE   Osteoporosis is a disease in which the bones lose minerals and strength with aging. This can result in serious bone fractures. Your risk for osteoporosis can be identified using a bone density scan.  If you are 42 years of age or older, or if you are at risk for osteoporosis and fractures, ask your health care provider if you should be screened.  Ask your health care provider whether you should take a calcium or vitamin D supplement to lower your risk for osteoporosis.  Menopause may have certain physical symptoms and risks.  Hormone replacement therapy may reduce some of these symptoms and risks. Talk to your health care provider about whether hormone replacement therapy is right for you.  HOME CARE INSTRUCTIONS   Schedule regular health, dental, and eye  exams.  Stay current with your immunizations.   Do not use any tobacco products including cigarettes, chewing tobacco, or electronic cigarettes.  If you are pregnant, do not drink alcohol.  If you are breastfeeding, limit how much and how often you drink alcohol.  Limit alcohol intake to no more than 1 drink per day for nonpregnant women. One drink equals 12 ounces of beer, 5 ounces of wine, or 1 ounces of hard liquor.  Do not use street drugs.  Do  not share needles.  Ask your health care provider for help if you need support or information about quitting drugs.  Tell your health care provider if you often feel depressed.  Tell your health care provider if you have ever been abused or do not feel safe at home.   This information is not intended to replace advice given to you by your health care provider. Make sure you discuss any questions you have with your health care provider.   Document Released: 08/14/2010 Document Revised: 02/19/2014 Document Reviewed: 12/31/2012 Elsevier Interactive Patient Education [employer].

## 2015-09-28 LAB — HM PAP SMEAR: HM PAP: NEGATIVE

## 2016-01-25 ENCOUNTER — Other Ambulatory Visit: Payer: Self-pay | Admitting: Emergency Medicine

## 2016-10-30 ENCOUNTER — Encounter: Payer: Self-pay | Admitting: Obstetrics and Gynecology

## 2016-10-31 ENCOUNTER — Encounter: Payer: Self-pay | Admitting: Obstetrics and Gynecology

## 2016-10-31 ENCOUNTER — Ambulatory Visit (INDEPENDENT_AMBULATORY_CARE_PROVIDER_SITE_OTHER): Payer: BLUE CROSS/BLUE SHIELD | Admitting: Obstetrics and Gynecology

## 2016-10-31 VITALS — BP 124/84 | HR 64 | Ht 65.0 in | Wt 169.0 lb

## 2016-10-31 DIAGNOSIS — Z01419 Encounter for gynecological examination (general) (routine) without abnormal findings: Secondary | ICD-10-CM | POA: Diagnosis not present

## 2016-10-31 DIAGNOSIS — Z131 Encounter for screening for diabetes mellitus: Secondary | ICD-10-CM

## 2016-10-31 DIAGNOSIS — Z3009 Encounter for other general counseling and advice on contraception: Secondary | ICD-10-CM

## 2016-10-31 NOTE — Progress Notes (Signed)
Patient ID: Judy Lozano, female   DOB: 20-Aug-1985, 31 y.o.   MRN: [id number]     Gynecology Annual Exam  PCP: Judy Grana, FNP  Chief Complaint:  Chief Complaint  Patient presents with  . Gynecologic Exam    History of Present Illness: Patient is a 31 y.o. [id number] presents for annual exam. The patient has no complaints today.   LMP: Patient's last menstrual period was 10/08/2016. Average Interval: regular, 28 days Duration of flow: 5 days Heavy Menses: no Clots: no Intermenstrual Bleeding: no Postcoital Bleeding: no Dysmenorrhea: no  The patient is sexually active. She currently uses NuvaRing vaginal inserts for contraception. She denies dyspareunia.  The patient does perform self breast exams.  There is no notable family history of breast or ovarian cancer in her family.  The patient wears seatbelts: yes.   The patient has regular exercise: not asked.    The patient denies current symptoms of depression.    Review of Systems: Review of Systems  Constitutional: Negative for chills and fever.  HENT: Negative for congestion.   Respiratory: Negative for cough and shortness of breath.   Cardiovascular: Negative for chest pain and palpitations.  Gastrointestinal: Negative for abdominal pain, constipation, diarrhea, heartburn, nausea and vomiting.  Genitourinary: Negative for dysuria, frequency and urgency.  Skin: Negative for itching and rash.  Neurological: Negative for dizziness and headaches.  Endo/Heme/Allergies: Negative for polydipsia.  Psychiatric/Behavioral: Negative for depression.    Past Medical History:  Past Medical History:  Diagnosis Date  . Chronic headaches   . Depression   . Genital herpes 2012  . Left elbow fracture 09/2014  . Ovarian cyst     Past Surgical History:  Past Surgical History:  Procedure Laterality Date  . DIAGNOSTIC LAPAROSCOPY  2012   IUD removal in intra-abdominal wall  ([facility])  . GALLBLADDER SURGERY       Gynecologic History:  Patient's last menstrual period was 10/08/2016. Contraception: NuvaRing vaginal inserts Last Pap: Results were: 09/28/15 no abnormalities   Obstetric History: [id number]  Family History:  Family History  Problem Relation Age of Onset  . Depression Mother   . Heart disease Father   . Diabetes Father   . Diabetes Maternal Aunt   . Cancer Maternal Aunt 75       breast  . Cancer Maternal Grandfather   . Diabetes Paternal Grandmother   . Cancer Maternal Aunt 30       breast    Social History:  Social History   Social History  . Marital status: Married    Spouse name: N/A  . Number of children: N/A  . Years of education: N/A   Occupational History  . Not on file.   Social History Main Topics  . Smoking status: Never Smoker  . Smokeless tobacco: Never Used  . Alcohol use Yes     Comment: Occasional  . Drug use: No  . Sexual activity: Yes    Birth control/ protection: Inserts   Other Topics Concern  . 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   Lives [place] with husband and 3 children -11,8,3.      Diet- Has improved.    Exercise- Not right now, not exercising due to time.     Allergies:  No Known Allergies  Medications: Prior to Admission medications   Medication Sig Start Date End Date Taking? Authorizing Provider  NUVARING 0.12-0.015 MG/24HR vaginal ring  09/11/16   [provider]    Physical Exam Vitals: Blood pressure 124/84, pulse 64, height  (1.651 m), weight 169 lb (76.7 kg), last menstrual period 10/08/2016.  General: NAD HEENT: normocephalic, anicteric Thyroid: no enlargement, no palpable nodules Pulmonary: No increased work of breathing, CTAB Cardiovascular: RRR, distal pulses 2+ Breast: Breast symmetrical, no tenderness, no palpable nodules or masses, no skin or nipple retraction present, no nipple discharge.  No axillary or supraclavicular lymphadenopathy. Abdomen: NABS,  soft, non-tender, non-distended.  Umbilicus without lesions.  No hepatomegaly, splenomegaly or masses palpable. No evidence of hernia  Genitourinary:  External: Normal external female genitalia.  Normal urethral meatus, normal  Bartholin's and Skene's glands.    Vagina: Normal vaginal mucosa, no evidence of prolapse.    Cervix: Grossly normal in appearance, no bleeding  Uterus: Non-enlarged, mobile, normal contour.  No CMT  Adnexa: ovaries non-enlarged, no adnexal masses  Rectal: deferred  Lymphatic: no evidence of inguinal lymphadenopathy Extremities: no edema, erythema, or tenderness Neurologic: Grossly intact Psychiatric: mood appropriate, affect full  Female chaperone present for pelvic and breast  portions of the physical exam    Assessment: 31 y.o. [id number] routine annual exam  Plan: Problem List Items Addressed This Visit    None    Visit Diagnoses    Screening for diabetes mellitus    -  Primary   Relevant Orders   Hemoglobin A1c   Encounter for gynecological examination without abnormal finding       Counseling for birth control regarding intrauterine device (IUD)          1) STI screening was offered and declined  2) ASCCP guidelines and rational discussed.  Patient opts for every 3 years screening interval - next 2020  3) Contraception - possibly interested in [place], call with next menses.  Handout given  4) Routine healthcare maintenance including cholesterol, diabetes screening discussed managed by PCP - strong family history of DM HgbA1C was obtained  5) Follow up 1 year for routine annual exam

## 2016-10-31 NOTE — Patient Instructions (Signed)
Preventive Care 18-39 Years, Female Preventive care refers to lifestyle choices and visits with your health care provider that can promote health and wellness. What does preventive care include?  A yearly physical exam. This is also called an annual well check.  Dental exams once or twice a year.  Routine eye exams. Ask your health care provider how often you should have your eyes checked.  Personal lifestyle choices, including: ? Daily care of your teeth and gums. ? Regular physical activity. ? Eating a healthy diet. ? Avoiding tobacco and drug use. ? Limiting alcohol use. ? Practicing safe sex. ? Taking vitamin and mineral supplements as recommended by your health care provider. What happens during an annual well check? The services and screenings done by your health care provider during your annual well check will depend on your age, overall health, lifestyle risk factors, and family history of disease. Counseling Your health care provider may ask you questions about your:  Alcohol use.  Tobacco use.  Drug use.  Emotional well-being.  Home and relationship well-being.  Sexual activity.  Eating habits.  Work and work Statistician.  Method of birth control.  Menstrual cycle.  Pregnancy history.  Screening You may have the following tests or measurements:  Height, weight, and BMI.  Diabetes screening. This is done by checking your blood sugar (glucose) after you have not eaten for a while (fasting).  Blood pressure.  Lipid and cholesterol levels. These may be checked every 5 years starting at age 66.  Skin check.  Hepatitis C blood test.  Hepatitis B blood test.  Sexually transmitted disease (STD) testing.  BRCA-related cancer screening. This may be done if you have a family history of breast, ovarian, tubal, or peritoneal cancers.  Pelvic exam and Pap test. This may be done every 3 years starting at age 40. Starting at age 59, this may be done every 5  years if you have a Pap test in combination with an HPV test.  Discuss your test results, treatment options, and if necessary, the need for more tests with your health care provider. Vaccines Your health care provider may recommend certain vaccines, such as:  Influenza vaccine. This is recommended every year.  Tetanus, diphtheria, and acellular pertussis (Tdap, Td) vaccine. You may need a Td booster every 10 years.  Varicella vaccine. You may need this if you have not been vaccinated.  HPV vaccine. If you are 69 or younger, you may need three doses over 6 months.  Measles, mumps, and rubella (MMR) vaccine. You may need at least one dose of MMR. You may also need a second dose.  Pneumococcal 13-valent conjugate (PCV13) vaccine. You may need this if you have certain conditions and were not previously vaccinated.  Pneumococcal polysaccharide (PPSV23) vaccine. You may need one or two doses if you smoke cigarettes or if you have certain conditions.  Meningococcal vaccine. One dose is recommended if you are age 27-21 years and a first-year college student living in a residence hall, or if you have one of several medical conditions. You may also need additional booster doses.  Hepatitis A vaccine. You may need this if you have certain conditions or if you travel or work in places where you may be exposed to hepatitis A.  Hepatitis B vaccine. You may need this if you have certain conditions or if you travel or work in places where you may be exposed to hepatitis B.  Haemophilus influenzae type b (Hib) vaccine. You may need this if  you have certain risk factors.  Talk to your health care provider about which screenings and vaccines you need and how often you need them. This information is not intended to replace advice given to you by your health care provider. Make sure you discuss any questions you have with your health care provider. Document Released: 03/27/2001 Document Revised: 10/19/2015  Document Reviewed: 11/30/2014 Elsevier Interactive Patient Education  2017 [employer].

## 2016-11-01 LAB — HEMOGLOBIN A1C
Est. average glucose Bld gHb Est-mCnc: 91 mg/dL
Hgb A1c MFr Bld: 4.8 % (ref 4.8–5.6)

## 2016-11-05 ENCOUNTER — Encounter: Payer: Self-pay | Admitting: Obstetrics and Gynecology

## 2016-11-12 ENCOUNTER — Encounter: Payer: Self-pay | Admitting: Obstetrics and Gynecology

## 2016-11-13 ENCOUNTER — Telehealth: Payer: Self-pay | Admitting: Obstetrics and Gynecology

## 2016-11-13 NOTE — Telephone Encounter (Signed)
Pt is schedule 11/23/16 with AMS for Mirena insertion

## 2016-11-23 ENCOUNTER — Encounter: Payer: Self-pay | Admitting: Obstetrics and Gynecology

## 2016-11-23 ENCOUNTER — Ambulatory Visit (INDEPENDENT_AMBULATORY_CARE_PROVIDER_SITE_OTHER): Payer: BLUE CROSS/BLUE SHIELD | Admitting: Obstetrics and Gynecology

## 2016-11-23 VITALS — BP 112/66 | HR 76 | Ht 65.0 in | Wt 171.0 lb

## 2016-11-23 DIAGNOSIS — Z30430 Encounter for insertion of intrauterine contraceptive device: Secondary | ICD-10-CM

## 2016-11-23 NOTE — Progress Notes (Signed)
   GYNECOLOGY OFFICE PROCEDURE NOTE  Judy Lozano is a 31 y.o. [phone] here for Mirena a IUD insertion. No GYN concerns.  Last pap smear was on 09/28/2015 and was normal.  The patient is currently using nuvaring for contraception and her LMP is Patient's last menstrual period was 11/12/2016..  The indication for her IUD is contraception/cycle control.  IUD Insertion Procedure Note Patient identified, informed consent performed, consent signed.   Discussed risks of irregular bleeding, cramping, infection, malpositioning, expulsion or uterine perforation of the IUD (1:1000 placements)  which may require further procedure such as laparoscopy.  IUD while effective at preventing pregnancy do not prevent transmission of sexually transmitted diseases and use of barrier methods for this purpose was discussed. Time out was performed.  Urine pregnancy test negative.  Speculum placed in the vagina.  Cervix visualized.  Cleaned with Betadine x 2.  Grasped anteriorly with a single tooth tenaculum.  Uterus sounded to 9 cm. IUD placed per manufacturer's recommendations.  Strings trimmed to 3 cm. Tenaculum was removed, good hemostasis noted.  Patient tolerated procedure well.   Patient was given post-procedure instructions.  She was advised to have backup contraception for one week.  Patient was also asked to check IUD strings periodically and follow up in 6 weeks for IUD check.  [name], MD, [facility], [facility]

## 2016-12-26 NOTE — Telephone Encounter (Signed)
Mirena stock provided for this patient.

## 2017-01-07 ENCOUNTER — Ambulatory Visit: Payer: BLUE CROSS/BLUE SHIELD | Admitting: Obstetrics and Gynecology

## 2017-06-20 ENCOUNTER — Encounter: Payer: Self-pay | Admitting: Obstetrics and Gynecology

## 2017-06-20 ENCOUNTER — Ambulatory Visit (INDEPENDENT_AMBULATORY_CARE_PROVIDER_SITE_OTHER): Payer: 59

## 2017-06-20 ENCOUNTER — Ambulatory Visit (INDEPENDENT_AMBULATORY_CARE_PROVIDER_SITE_OTHER): Payer: 59 | Admitting: Obstetrics and Gynecology

## 2017-06-20 VITALS — BP 130/90 | Ht 65.0 in | Wt 207.0 lb

## 2017-06-20 DIAGNOSIS — Z30431 Encounter for routine checking of intrauterine contraceptive device: Secondary | ICD-10-CM

## 2017-06-20 DIAGNOSIS — R1031 Right lower quadrant pain: Secondary | ICD-10-CM

## 2017-06-20 NOTE — Progress Notes (Signed)
Allegra Grana, FNP   Chief Complaint  Patient presents with  . Pelvic Pain    HPI:      Ms. Judy Lozano is a 32 y.o. [phone] who LMP was No LMP recorded. (Menstrual status: IUD)., presents today for intermittent RLQ pains for the past several months. Intensity of pain has increased the past wk and pt is concerned. Sx are sharp and fleeting, lasting a few seconds. No aggrav/allev factors. No meds to treat.  Mirena placed 11/23/16 for contraception. Pt is amenorrheic, without BTB, cramping.  No GI, urin, vag sx. She is sex active, no new partners. No dyspareunia/postcoital bleeding.  Hx of ovar cysts in the past.   Past Medical History:  Diagnosis Date  . Chronic headaches   . Depression   . Genital herpes 2012  . Left elbow fracture 09/2014  . Ovarian cyst     Past Surgical History:  Procedure Laterality Date  . DIAGNOSTIC LAPAROSCOPY  2012   IUD removal in intra-abdominal wall  ([facility])  . GALLBLADDER SURGERY      Family History  Problem Relation Age of Onset  . Depression Mother   . Heart disease Father   . Diabetes Father   . Diabetes Maternal Aunt   . Cancer Maternal Aunt 75       breast  . Cancer Maternal Grandfather   . Diabetes Paternal Grandmother   . Cancer Maternal Aunt 9       breast    Social History   Socioeconomic History  . Marital status: Married    Spouse name: Not on file  . Number of children: Not on file  . Years of education: Not on file  . Highest education level: Not on file  Occupational History  . Not on file  Social Needs  . Financial resource strain: Not on file  . Food insecurity:    Worry: Not on file    Inability: Not on file  . Transportation needs:    Medical: Not on file    Non-medical: Not on file  Tobacco Use  . Smoking status: Never Smoker  . Smokeless tobacco: Never Used  Substance and Sexual Activity  . Alcohol use: Yes    Comment: Occasional  . Drug use: No  . Sexual activity: Yes    Birth  control/protection: Inserts  Lifestyle  . Physical activity:    Days per week: Not on file    Minutes per session: Not on file  . Stress: Not on file  Relationships  . Social connections:    Talks on phone: Not on file    Gets together: Not on file    Attends religious service: Not on file    Active member of club or organization: Not on file    Attends meetings of clubs or organizations: Not on file    Relationship status: Not on file  . Intimate partner violence:    Fear of current or ex partner: Not on file    Emotionally abused: Not on file    Physically abused: Not on file    Forced sexual activity: Not on file  Other Topics Concern  . Not on file  Social History Narrative   Works at International aid/development worker clinic.   Lives [place] with husband and 3 children -11,8,3.      Diet- Has improved.    Exercise- Not right now, not exercising due to time.     Outpatient Medications Prior to Visit  Medication  Sig Dispense Refill  . levonorgestrel (MIRENA) 20 MCG/24HR IUD 1 each by Intrauterine route once.     No facility-administered medications prior to visit.     ROS:  Review of Systems  Constitutional: Negative for fever.  Gastrointestinal: Negative for blood in stool, constipation, diarrhea, nausea and vomiting.  Genitourinary: Positive for pelvic pain. Negative for dyspareunia, dysuria, flank pain, frequency, hematuria, urgency, vaginal bleeding, vaginal discharge and vaginal pain.  Musculoskeletal: Negative for back pain.  Skin: Negative for rash.    OBJECTIVE:   Vitals:  BP 130/90   Ht  (1.651 m)   Wt 207 lb (93.9 kg)   BMI 34.45 kg/m   Physical Exam  Constitutional: She is oriented to person, place, and time. Vital signs are normal. She appears well-developed.  Pulmonary/Chest: Effort normal.  Genitourinary: Vagina normal and uterus normal. There is no rash, tenderness or lesion on the right labia. There is no rash, tenderness or lesion on the left labia.  Uterus is not enlarged and not tender. Cervix exhibits no motion tenderness. Right adnexum displays no mass and no tenderness. Left adnexum displays no mass and no tenderness. No erythema or tenderness in the vagina. No vaginal discharge found.  Genitourinary Comments: IUD STRINGS IN PLACE  Musculoskeletal: Normal range of motion.  Neurological: She is alert and oriented to person, place, and time.  Psychiatric: She has a normal mood and affect. Her behavior is normal. Thought content normal.  Vitals reviewed.   Results: ULTRASOUND REPORT  Patient Name: Judy Lozano DOB: 11/02/1985 MRN: [id number]  Location: [facility]  Date of Service: 06/20/2017    Indications:RLQ pain Findings:  The uterus is retroverted and measures 8.17 x 4.72 x 3.22cm. Echo texture is homogenous without evidence of focal masses.  The Endometrium measures 4.37 mm. - Mirena IUD appears to be in the correct location in the fundus of the uterus  Right Ovary measures 2.92 x 2.26 x 2.30 cm. It is normal in appearance. Left Ovary measures 2.99 x 1.52 x 1.56 cm. It is normal in appearance. Survey of the adnexa demonstrates no adnexal masses. There is no free fluid in the cul de sac.  Impression: 1. Normal gyn ultrasound  Recommendations: 1.Clinical correlation with the patient's History and Physical Exam.  Willette Alma, RDMS, RVT  Assessment/Plan: RLQ abdominal pain - Neg exam. Neg GYN u/s. Question GI. F/u prn.  - Plan: US PELVIS TRANSVANGINAL NON-OB (TV ONLY)  Encounter for routine checking of intrauterine contraceptive device (IUD) - IUD in correct location per u/s.  - Plan: US PELVIS TRANSVANGINAL NON-OB (TV ONLY)    Return in about 1 day (around 06/21/2017) for GYN u/s for RLQ pain, IUD check--[facility] to call pt.  Alicia B. Copland, PA-C 06/20/2017 3:06 PM

## 2017-06-20 NOTE — Patient Instructions (Signed)
I value your feedback and entrusting us with your care. If you get a [facility] patient survey, I would appreciate you taking the time to let us know about your experience today. Thank you! 

## 2018-02-18 DIAGNOSIS — J029 Acute pharyngitis, unspecified: Secondary | ICD-10-CM | POA: Diagnosis not present

## 2018-02-18 DIAGNOSIS — J069 Acute upper respiratory infection, unspecified: Secondary | ICD-10-CM | POA: Diagnosis not present

## 2018-12-01 ENCOUNTER — Other Ambulatory Visit (HOSPITAL_COMMUNITY)
Admission: RE | Admit: 2018-12-01 | Discharge: 2018-12-01 | Disposition: A | Discharge status: Home / Self Care | Payer: 59 | Source: Ambulatory Visit | Attending: Obstetrics and Gynecology | Admitting: Obstetrics and Gynecology

## 2018-12-01 ENCOUNTER — Ambulatory Visit (INDEPENDENT_AMBULATORY_CARE_PROVIDER_SITE_OTHER): Payer: 59 | Admitting: Obstetrics and Gynecology

## 2018-12-01 ENCOUNTER — Encounter: Payer: Self-pay | Admitting: Obstetrics and Gynecology

## 2018-12-01 ENCOUNTER — Other Ambulatory Visit: Payer: Self-pay

## 2018-12-01 VITALS — BP 140/84 | HR 94 | Ht 65.0 in | Wt 218.0 lb

## 2018-12-01 DIAGNOSIS — Z1239 Encounter for other screening for malignant neoplasm of breast: Secondary | ICD-10-CM

## 2018-12-01 DIAGNOSIS — Z124 Encounter for screening for malignant neoplasm of cervix: Secondary | ICD-10-CM | POA: Diagnosis present

## 2018-12-01 DIAGNOSIS — F411 Generalized anxiety disorder: Secondary | ICD-10-CM

## 2018-12-01 DIAGNOSIS — Z01419 Encounter for gynecological examination (general) (routine) without abnormal findings: Secondary | ICD-10-CM | POA: Diagnosis not present

## 2018-12-01 MED ORDER — ESCITALOPRAM OXALATE 10 MG PO TABS: 10.0000 mg | ORAL_TABLET | Freq: Every day | ORAL | 2 refills | Status: DC

## 2018-12-01 NOTE — Progress Notes (Addendum)
Gynecology Annual Exam   PCP: Burnard Hawthorne, FNP  Chief Complaint:  Chief Complaint  Patient presents with  . Gynecologic Exam    History of Present Illness: Patient is a 33 y.o. [id number] presents for annual exam. The patient has no complaints today.   LMP: No LMP recorded. (Menstrual status: IUD). Absent on IUD  The patient is sexually active. She currently uses IUD for contraception. She denies dyspareunia.  There is no notable family history of breast or ovarian cancer in her family.  The patient wears seatbelts: yes.   The patient has regular exercise: not asked.    The patient reports current symptoms of increased anxiety..    Review of Systems: Review of Systems  Constitutional: Negative for chills and fever.  HENT: Negative for congestion.   Respiratory: Negative for cough and shortness of breath.   Cardiovascular: Negative for chest pain and palpitations.  Gastrointestinal: Negative for abdominal pain, constipation, diarrhea, heartburn, nausea and vomiting.  Genitourinary: Negative for dysuria, frequency and urgency.  Skin: Negative for itching and rash.  Neurological: Negative for dizziness and headaches.  Endo/Heme/Allergies: Negative for polydipsia.  Psychiatric/Behavioral: Negative for depression.    Past Medical History:  Past Medical History:  Diagnosis Date  . Chronic headaches   . Depression   . Genital herpes 2012  . Left elbow fracture 09/2014  . Ovarian cyst     Past Surgical History:  Past Surgical History:  Procedure Laterality Date  . DIAGNOSTIC LAPAROSCOPY  2012   IUD removal in intra-abdominal wall  ([facility])  . GALLBLADDER SURGERY      Gynecologic History:  No LMP recorded. (Menstrual status: IUD). Contraception:11/23/2016 Mirena IUD Last Pap: Results were: no abnormalities   Obstetric History: [id number]  Family History:  Family History  Problem Relation Age of Onset  . Depression Mother   . Heart disease Father   .  Diabetes Father   . Diabetes Maternal Aunt   . Breast cancer Maternal Aunt 70  . Colon cancer Maternal Grandfather   . Diabetes Paternal Grandmother   . Breast cancer Maternal Aunt 50    Social History:  Social History   Socioeconomic History  . Marital status: Married    Spouse name: Not on file  . Number of children: Not on file  . Years of education: Not on file  . Highest education level: Not on file  Occupational History  . Not on file  Social Needs  . Financial resource strain: Not on file  . Food insecurity    Worry: Not on file    Inability: Not on file  . Transportation needs    Medical: Not on file    Non-medical: Not on file  Tobacco Use  . Smoking status: Never Smoker  . Smokeless tobacco: Never Used  Substance and Sexual Activity  . Alcohol use: Yes    Comment: Occasional  . Drug use: No  . Sexual activity: Yes    Birth control/protection: I.U.D.  Lifestyle  . Physical activity    Days per week: Not on file    Minutes per session: Not on file  . Stress: Not on file  Relationships  . Social Herbalist on phone: Not on file    Gets together: Not on file    Attends religious service: Not on file    Active member of club or organization: Not on file    Attends meetings of clubs or organizations: Not on file  Relationship status: Not on file  . Intimate partner violence    Fear of current or ex partner: Not on file    Emotionally abused: Not on file    Physically abused: Not on file    Forced sexual activity: Not on file  Other Topics Concern  . 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   [place] and 3 children -11,8,3.      Diet- Has improved.    Exercise- Not right now, not exercising due to time.     Allergies:  No Known Allergies  Medications: Prior to Admission medications   Medication Sig Start Date End Date Taking? Authorizing Provider  levonorgestrel (MIRENA) 20 MCG/24HR  IUD 1 each by Intrauterine route once.   [facility] [place]    Physical Exam Vitals: Blood pressure 140/84, pulse 94, height 5\' 5"  (1.651 m), weight 218 lb (98.9 kg).  General: NAD HEENT: normocephalic, anicteric Thyroid: no enlargement, no palpable nodules Pulmonary: No increased work of breathing, CTAB Cardiovascular: RRR, distal pulses 2+ Breast: Breast symmetrical, no tenderness, no palpable nodules or masses, no skin or nipple retraction present, no nipple discharge.  No axillary or supraclavicular lymphadenopathy. Abdomen: NABS, soft, non-tender, non-distended.  Umbilicus without lesions.  No hepatomegaly, splenomegaly or masses palpable. No evidence of hernia  Genitourinary:  External: Normal external female genitalia.  Normal urethral meatus, normal Bartholin's and Skene's glands.    Vagina: Normal vaginal mucosa, no evidence of prolapse.    Cervix: Grossly normal in appearance, no bleeding, IUD string visualized 2cm  Uterus: Non-enlarged, mobile, normal contour.  No CMT  Adnexa: ovaries non-enlarged, no adnexal masses  Rectal: deferred  Lymphatic: no evidence of inguinal lymphadenopathy Extremities: no edema, erythema, or tenderness Neurologic: Grossly intact Psychiatric: mood appropriate, affect full  Female chaperone present for pelvic and breast  portions of the physical exam    [facility]  PHQ-9 Total Score  9     GAD 7 : Generalized Anxiety Score 12/01/2018  Nervous, Anxious, on Edge 2  Control/stop worrying 2  Worry too much - different things 2  Trouble relaxing 2  Restless 0  Easily annoyed or irritable 3  Afraid - awful might happen 0  Total GAD 7 Score 11  Anxiety Difficulty Somewhat difficult      Assessment: 33 y.o. [id number] routine annual exam  Plan: Problem List Items Addressed This Visit    None    Visit Diagnoses    Encounter for gynecological examination without abnormal finding    -   Primary   Screening for malignant neoplasm of cervix       Relevant Orders   Cytology - PAP   Breast screening       Generalized anxiety disorder       Relevant Medications   escitalopram (LEXAPRO) 10 MG tablet      1) STI screening  was notoffered and therefore not obtained  2)  ASCCP guidelines and rational discussed.  Patient opts for every 3 years screening interval  3) Contraception - the patient is currently using  IUD.  She is happy with her current form of contraception and plans to continue  4) Routine healthcare maintenance including cholesterol, diabetes screening discussed managed by PCP  5) Generalized anxiety - start lexapro 10mg  po daily.  GAD-7 is 11  6) Return in about 4 weeks (around 12/29/2018) for phone visit medication follow up.  [name], MD, [facility], [facility] 12/01/2018, 11:23 AM

## 2018-12-05 LAB — CYTOLOGY - PAP
Comment: NEGATIVE
Comment: NEGATIVE
Comment: NEGATIVE
Diagnosis: NEGATIVE
HPV 16: NEGATIVE
HPV 18 / 45: NEGATIVE
High risk HPV: POSITIVE — AB

## 2018-12-29 ENCOUNTER — Other Ambulatory Visit: Payer: Self-pay

## 2018-12-29 ENCOUNTER — Ambulatory Visit (INDEPENDENT_AMBULATORY_CARE_PROVIDER_SITE_OTHER): Payer: 59 | Admitting: Obstetrics and Gynecology

## 2018-12-29 ENCOUNTER — Encounter: Payer: Self-pay | Admitting: Obstetrics and Gynecology

## 2018-12-29 DIAGNOSIS — F419 Anxiety disorder, unspecified: Secondary | ICD-10-CM

## 2018-12-29 DIAGNOSIS — F329 Major depressive disorder, single episode, unspecified: Secondary | ICD-10-CM

## 2018-12-29 MED ORDER — ESCITALOPRAM OXALATE 10 MG PO TABS: 10.0000 mg | ORAL_TABLET | Freq: Every day | ORAL | 3 refills | Status: DC

## 2018-12-29 NOTE — Progress Notes (Signed)
I connected with Judy Lozano on 12/29/18 at  8:50 AM EST by telephone and verified that I am speaking with the correct person using two identifiers.   I discussed the limitations, risks, security and privacy concerns of performing an evaluation and management service by telephone and the availability of in person appointments. I also discussed with the patient that there may be a patient responsible charge related to this service. The patient expressed understanding and agreed to proceed.  The patient was at home I spoke with the patient from my workstation phone The names of people involved in this encounter were: Judy Lozano , and [name]   Obstetrics & Gynecology Office Visit   Chief Complaint:  Chief Complaint  Patient presents with  . Follow-up    Anxiety    History of Present Illness: The patient is a 33 y.o. [phone]  female presenting follow up for symptoms of anxiety and depression.  The patient is currently taking Lexapro 10mg  for the management of her symptoms.  She has not had any recent situational stressors.  She reports symptoms good control of symptoms on current regimen. No issues with headaches or nausea.    The patient does have a pre-existing history of depression and anxiety.  She  does not a prior history of suicide attempts.  Review of Systems: review of systems negative unless otherwise noted in HPI  Past Medical History:  Past Medical History:  Diagnosis Date  . Chronic headaches   . Depression   . Genital herpes 2012  . Left elbow fracture 09/2014  . Ovarian cyst     Past Surgical History:  Past Surgical History:  Procedure Laterality Date  . DIAGNOSTIC LAPAROSCOPY  2012   IUD removal in intra-abdominal wall  ([facility])  . GALLBLADDER SURGERY      Gynecologic History: No LMP recorded. (Menstrual status: IUD).  Obstetric History: 2013  Family History:  Family History  Problem Relation Age of Onset  . Depression  Mother   . Heart disease Father   . Diabetes Father   . Diabetes Maternal Aunt   . Breast cancer Maternal Aunt 70  . Colon cancer Maternal Grandfather   . Diabetes Paternal Grandmother   . Breast cancer Maternal Aunt 41    Social History:  Social History   Socioeconomic History  . Marital status: Married    Spouse name: Not on file  . Number of children: Not on file  . Years of education: Not on file  . Highest education level: Not on file  Occupational History  . Not on file  Social Needs  . Financial resource strain: Not on file  . Food insecurity    Worry: Not on file    Inability: Not on file  . Transportation needs    Medical: Not on file    Non-medical: Not on file  Tobacco Use  . Smoking status: Never Smoker  . Smokeless tobacco: Never Used  Substance and Sexual Activity  . Alcohol use: Yes    Comment: Occasional  . Drug use: No  . Sexual activity: Yes    Birth control/protection: I.U.D.  Lifestyle  . Physical activity    Days per week: Not on file    Minutes per session: Not on file  . Stress: Not on file  Relationships  . Social 44 on phone: Not on file    Gets together: Not on file    Attends religious service: Not on  file    Active member of club or organization: Not on file    Attends meetings of clubs or organizations: Not on file    Relationship status: Not on file  . Intimate partner violence    Fear of current or ex partner: Not on file    Emotionally abused: Not on file    Physically abused: Not on file    Forced sexual activity: Not on file  Other Topics Concern  . 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   [place] and 3 children -11,8,3.      Diet- Has improved.    Exercise- Not right now, not exercising due to time.     Allergies:  No Known Allergies  Medications: Prior to Admission medications   Medication Sig Start Date End Date Taking? Authorizing Provider   escitalopram (LEXAPRO) 10 MG tablet Take 1 tablet (10 mg total) by mouth daily. 12/01/18 12/01/19 Yes Vena AustriaStaebler, Vaniyah Lansky, MD  levonorgestrel (MIRENA) 20 MCG/24HR IUD 1 each by Intrauterine route once.   Yes [facility], [place]    Physical Exam Vitals: There were no vitals filed for this visit. No LMP recorded. (Menstrual status: IUD).  No physical exam as this was a remote telephone visit to promote social distancing during the current COVID-19 Pandemic    GAD 7 : Generalized Anxiety Score 12/29/2018 12/01/2018  Nervous, Anxious, on Edge 1 2  Control/stop worrying 0 2  Worry too much - different things 1 2  Trouble relaxing 0 2  Restless 0 0  Easily annoyed or irritable 1 3  Afraid - awful might happen 0 0  Total GAD 7 Score 3 11  Anxiety Difficulty Not difficult at all Somewhat difficult    Depression screen [facility] 12/29/2018 12/01/2018 09/21/2015  Decreased Interest 1 1 0  Down, Depressed, Hopeless 1 2 0  PHQ - 2 Score 2 3 0  Altered sleeping 0 1 0  Tired, decreased energy 1 3 3   Change in appetite 1 1 0  Feeling bad or failure about yourself  1 1 2   Trouble concentrating 0 0 1  Moving slowly or fidgety/restless 0 0 0  Suicidal thoughts 0 0 0  PHQ-9 Score 5 9 6   Difficult doing work/chores Not difficult at all - -    Dep[facility] screen [facility] 12/29/2018 12/01/2018 09/21/2015 09/21/2015  Decreased Interest 1 1 0 0  Down, Depressed, Hopeless 1 2 0 0  PHQ - 2 Score 2 3 0 0  Altered sleeping 0 1 0 -  Tired, decreased energy 1 3 3  -  Change in appetite 1 1 0 -  Feeling bad or failure about yourself  1 1 2  -  Trouble concentrating 0 0 1 -  Moving slowly or fidgety/restless 0 0 0 -  Suicidal thoughts 0 0 0 -  PHQ-9 Score 5 9 6  -  Difficult doing work/chores Not difficult at all - - -     Assessment: 33 y.o. [id number] follow up anxiety and depression  Plan: Problem List Items Addressed This Visit    None    Visit Diagnoses    Anxiety and depression    -   Primary   Relevant Medications   escitalopram (LEXAPRO) 10 MG tablet      1) Anxiety/Depression - Good response to lexapro 10mg  po, continue at present dose  2) Thyroid and B12 screen has not been obtained previously  3) Telephone time  5:30min  5) Return in about 11 months (around 11/28/2019) for annual.    Malachy Mood, MD, [place], [place] 12/29/2018, 9:30 AM

## 2019-05-07 ENCOUNTER — Ambulatory Visit: Payer: Medicaid Other | Admitting: Obstetrics and Gynecology

## 2019-08-18 ENCOUNTER — Emergency Department: Payer: Self-pay

## 2019-08-18 ENCOUNTER — Other Ambulatory Visit: Payer: Self-pay

## 2019-08-18 ENCOUNTER — Emergency Department
Admission: EM | Admit: 2019-08-18 | Discharge: 2019-08-18 | Disposition: A | Discharge status: Home / Self Care | Payer: Self-pay | Attending: Emergency Medicine | Admitting: Emergency Medicine

## 2019-08-18 ENCOUNTER — Encounter: Payer: Self-pay | Admitting: Emergency Medicine

## 2019-08-18 DIAGNOSIS — M542 Cervicalgia: Secondary | ICD-10-CM | POA: Insufficient documentation

## 2019-08-18 DIAGNOSIS — G43909 Migraine, unspecified, not intractable, without status migrainosus: Secondary | ICD-10-CM

## 2019-08-18 DIAGNOSIS — R519 Headache, unspecified: Secondary | ICD-10-CM | POA: Insufficient documentation

## 2019-08-18 MED ORDER — METOCLOPRAMIDE HCL 5 MG/ML IJ SOLN
10.0000 mg | Freq: Once | INTRAMUSCULAR | Status: AC
  Administered 2019-08-18: 10 mg via INTRAVENOUS
  Filled 2019-08-18: qty 2

## 2019-08-18 MED ORDER — DIPHENHYDRAMINE HCL 50 MG/ML IJ SOLN
50.0000 mg | Freq: Once | INTRAMUSCULAR | Status: AC
  Administered 2019-08-18: 50 mg via INTRAVENOUS
  Filled 2019-08-18: qty 1

## 2019-08-18 MED ORDER — KETOROLAC TROMETHAMINE 30 MG/ML IJ SOLN
30.0000 mg | Freq: Once | INTRAMUSCULAR | Status: AC
  Administered 2019-08-18: 30 mg via INTRAVENOUS
  Filled 2019-08-18: qty 1

## 2019-08-18 MED ORDER — SODIUM CHLORIDE 0.9 % IV BOLUS
1000.0000 mL | Freq: Once | INTRAVENOUS | Status: AC
  Administered 2019-08-18: 1000 mL via INTRAVENOUS

## 2019-08-18 MED ORDER — BUTALBITAL-APAP-CAFFEINE 50-325-40 MG PO TABS: 1.0000 | ORAL_TABLET | Freq: Four times a day (QID) | ORAL | 0 refills | Status: AC | PRN

## 2019-08-18 NOTE — ED Notes (Signed)
See triage note  States she developed migraine h/a about 3 days ago  Denies any trauma  States OTC meds not working

## 2019-08-18 NOTE — ED Provider Notes (Signed)
[facility] Emergency Department Provider Note  Time seen: 1:20 PM  I have reviewed the triage vital signs and the nursing notes.   HISTORY  Chief Complaint Migraine   HPI Judy Lozano is a 34 y.o. female with past medical history of depression presents to the emergency department for headache.  According to the patient since Saturday she has been experiencing a moderate dull aching headache across her entire head.  Also states mild neck soreness as well.  Denies any fever.  Denies any recent illnesses cough congestion or sinus drainage.  Patient denies any photo or phonophobia.  States on Saturday the headache was quite mild but progressively worsened throughout the day.  Denies any acute development of headache.   Denies any weakness numbness or tingling.  Past Medical History:  Diagnosis Date  . Chronic headaches   . Depression   . Genital herpes 2012  . Left elbow fracture 09/2014  . Ovarian cyst     Patient Active Problem List   Diagnosis Date Noted  . Depression 09/21/2015  . Routine general medical examination at a health care facility 09/21/2015    Past Surgical History:  Procedure Laterality Date  . DIAGNOSTIC LAPAROSCOPY  2012   IUD removal in intra-abdominal wall  ([facility])  . GALLBLADDER SURGERY      Prior to Admission medications   Medication Sig Start Date End Date Taking? Authorizing Provider  escitalopram (LEXAPRO) 10 MG tablet Take 1 tablet (10 mg total) by mouth daily. 12/29/18 12/29/19  [name], MD  levonorgestrel (MIRENA) 20 MCG/24HR IUD 1 each by Intrauterine route once.    [facility] [place]    No Known Allergies  Family History  Problem Relation Age of Onset  . Depression Mother   . Heart disease Father   . Diabetes Father   . Diabetes Maternal Aunt   . Breast cancer Maternal Aunt 70  . Colon cancer Maternal Grandfather   . Diabetes Paternal Grandmother   . Breast cancer Maternal Aunt 43     Social History Social History   Tobacco Use  . Smoking status: Never Smoker  . Smokeless tobacco: Never Used  Vaping Use  . Vaping Use: Never used  Substance Use Topics  . Alcohol use: Yes    Comment: Occasional  . Drug use: No    Review of Systems Constitutional: Negative for fever. Cardiovascular: Negative for chest pain. Respiratory: Negative for shortness of breath. Gastrointestinal: Negative for abdominal pain Musculoskeletal: Negative for musculoskeletal complaints Neurological: Moderate global headache.  No other neurologic symptoms. All other ROS negative  ____________________________________________   PHYSICAL EXAM:  VITAL SIGNS: ED Triage Vitals  Enc Vitals Group     BP 08/18/19 1157 (!) 132/99     Pulse Rate 08/18/19 1157 82     Resp 08/18/19 1157 17     Temp 08/18/19 1157 97.9 F (36.6 C)     Temp Source 08/18/19 1157 Oral     SpO2 08/18/19 1157 98 %     Weight 08/18/19 1158 230 lb (104.3 kg)     Height 08/18/19 1158 5\' 5"  (1.651 m)     Head Circumference --      Peak Flow --      Pain Score 08/18/19 1158 9     Pain Loc --      Pain Edu? --      Excl. in GC? --    Constitutional: Alert and oriented. Well appearing and in no distress. Eyes:  Normal exam ENT      Head: Normocephalic and atraumatic.      Mouth/Throat: Mucous membranes are moist.  No meningismus. Cardiovascular: Normal rate, regular rhythm.  Respiratory: Normal respiratory effort without tachypnea nor retractions. Breath sounds are clear Gastrointestinal: Soft and nontender. No distention.  Musculoskeletal: Nontender with normal range of motion in all extremities Neurologic:  Normal speech and language. No gross focal neurologic deficits.  Equal grip strength bilaterally.  5/5 motor in all tremors.  No pronator drift.  Normal cranial nerves. Skin:  Skin is warm, dry and intact.  Psychiatric: Mood and affect are normal.    ____________________________________________   RADIOLOGY  CT scan shows no acute abnormality.  ____________________________________________   INITIAL IMPRESSION / ASSESSMENT AND PLAN / ED COURSE  Pertinent labs & imaging results that were available during my care of the patient were reviewed by me and considered in my medical decision making (see chart for details).   Patient presents emergency department for a moderate headache over the past 3 days.  Overall the patient appears well, no concerning red flags on physical exam or history.  Patient does state a history of headaches in the past but denies any known migraine history.  We will treat with Toradol Reglan Benadryl IV fluids and reassess.  CT scan is reassuringly negative.  Patient's neurological exam is reassuringly now normal.  Patient states considerable improvement in her headache.  We will discharge home with my typical headache return precautions.  Judy Lozano was evaluated in Emergency Department on 08/18/2019 for the symptoms described in the history of present illness. She was evaluated in the context of the global COVID-19 pandemic, which necessitated consideration that the patient might be at risk for infection with the SARS-CoV-2 virus that causes COVID-19. Institutional protocols and algorithms that pertain to the evaluation of patients at risk for COVID-19 are in a state of rapid change based on information released by regulatory bodies including the CDC and federal and state organizations. These policies and algorithms were followed during the patient's care in the ED.  ____________________________________________   FINAL CLINICAL IMPRESSION(S) / ED DIAGNOSES  Headache   Minna Antis, MD 08/18/19 1501

## 2019-08-18 NOTE — ED Notes (Signed)
Pt presentation discussed with EDP, Malinda; see new orders.

## 2019-08-18 NOTE — ED Triage Notes (Addendum)
Pt in via POV, reports migraine x 3 days, denies hx of same.  Denies changes to vision, denies photophobia.  Reports neck pain with headache, states yes to worst headache she has ever had, no relief with OTC meds.  Vitals WDL, NAD noted at this time.

## 2020-09-09 ENCOUNTER — Ambulatory Visit (INDEPENDENT_AMBULATORY_CARE_PROVIDER_SITE_OTHER): Payer: 59 | Admitting: Obstetrics and Gynecology

## 2020-09-09 ENCOUNTER — Other Ambulatory Visit (HOSPITAL_COMMUNITY)
Admission: RE | Admit: 2020-09-09 | Discharge: 2020-09-09 | Disposition: A | Discharge status: Home / Self Care | Payer: 59 | Source: Ambulatory Visit | Attending: Obstetrics and Gynecology | Admitting: Obstetrics and Gynecology

## 2020-09-09 ENCOUNTER — Other Ambulatory Visit: Payer: Self-pay

## 2020-09-09 ENCOUNTER — Encounter: Payer: Self-pay | Admitting: Obstetrics and Gynecology

## 2020-09-09 VITALS — BP 126/80 | Ht 65.0 in | Wt 231.0 lb

## 2020-09-09 DIAGNOSIS — Z6838 Body mass index (BMI) 38.0-38.9, adult: Secondary | ICD-10-CM

## 2020-09-09 DIAGNOSIS — Z124 Encounter for screening for malignant neoplasm of cervix: Secondary | ICD-10-CM | POA: Diagnosis not present

## 2020-09-09 DIAGNOSIS — Z833 Family history of diabetes mellitus: Secondary | ICD-10-CM | POA: Diagnosis not present

## 2020-09-09 DIAGNOSIS — Z01419 Encounter for gynecological examination (general) (routine) without abnormal findings: Secondary | ICD-10-CM | POA: Diagnosis not present

## 2020-09-09 DIAGNOSIS — E669 Obesity, unspecified: Secondary | ICD-10-CM | POA: Diagnosis not present

## 2020-09-09 DIAGNOSIS — F411 Generalized anxiety disorder: Secondary | ICD-10-CM

## 2020-09-09 DIAGNOSIS — Z1239 Encounter for other screening for malignant neoplasm of breast: Secondary | ICD-10-CM

## 2020-09-09 MED ORDER — BUPROPION HCL ER (XL) 150 MG PO TB24: 150.0000 mg | ORAL_TABLET | Freq: Every day | ORAL | 2 refills | Status: DC

## 2020-09-09 NOTE — Progress Notes (Signed)
Gynecology Annual Exam   PCP: Allegra Grana, FNP  Chief Complaint:  Chief Complaint  Patient presents with   Gynecologic Exam    History of Present Illness: Patient is a 35 y.o. [phone] presents for annual exam. The patient has no complaints today.   LMP: No LMP recorded. (Menstrual status: IUD). Amenorrhea Mirena IUD  The patient is sexually active. She currently uses IUD for contraception. She denies dyspareunia.  The patient does perform self breast exams.  There is no notable family history of breast or ovarian cancer in her family.  The patient wears seatbelts: yes.   The patient has regular exercise: not asked.    The patient reports current symptoms of depression.    The patient is a 35 y.o. female presenting initial evaluation for symptoms of anxiety and depression.  The patient is currently taking nothing for the management of her symptoms.  She has not had any recent situational stressors.  She reports symptoms of anhedonia, insomnia, irritability, and social anxiety.  She denies risk taking behavior, agorophobia, feelings of guilt, feelings of worthlessness, suicidal ideation, homicidal ideation, auditory hallucinations, and visual hallucinations.   The patient does have a pre-existing history of depression and anxiety.  She  does not a prior history of suicide attempts.  Previous treatment tied include lexapro    Review of Systems: Review of Systems  Constitutional:  Negative for chills and fever.  HENT:  Negative for congestion.   Respiratory:  Negative for cough and shortness of breath.   Cardiovascular:  Negative for chest pain and palpitations.  Gastrointestinal:  Negative for abdominal pain, constipation, diarrhea, heartburn, nausea and vomiting.  Genitourinary:  Negative for dysuria, frequency and urgency.  Skin:  Negative for itching and rash.  Neurological:  Negative for dizziness and headaches.  Endo/Heme/Allergies:  Negative for polydipsia.   Psychiatric/Behavioral:  Negative for depression.    Past Medical History:  Patient Active Problem List   Diagnosis Date Noted   Depression 09/21/2015   Routine general medical examination at a health care facility 09/21/2015    Past Surgical History:  Past Surgical History:  Procedure Laterality Date   DIAGNOSTIC LAPAROSCOPY  2012   IUD removal in intra-abdominal wall  ([facility])   GALLBLADDER SURGERY      Gynecologic History:  No LMP recorded. (Menstrual status: IUD). Contraception:11/23/2016 Mirena  IUD Last Pap: Results were: 10/19/2020NIL and HR HPV negative   Obstetric History: [id number]  Family History:  Family History  Problem Relation Age of Onset   Depression Mother    Heart disease Father    Diabetes Father    Diabetes Maternal Aunt    Breast cancer Maternal Aunt 63   Breast cancer Maternal Aunt 50   Colon cancer Maternal Grandfather    Diabetes Paternal Grandmother     Social History:  Social History   Socioeconomic History   Marital status: Married    Spouse name: Not on file   Number of children: Not on file   Years of education: Not on file   Highest education level: Not on file  Occupational History   Not on file  Tobacco Use   Smoking status: Never   Smokeless tobacco: Never  Vaping Use   Vaping Use: Never used  Substance and Sexual Activity   Alcohol use: Yes    Comment: Occasional   Drug use: Never   Sexual activity: Yes    Birth control/protection: I.U.D.    Comment: Mirena  Other Topics  Concern   Not on file  Social History Narrative   Works at [employer].   Lives [place] with husband and 3 children -11,8,3.      Diet- Has improved.    Exercise- Not right now, not exercising due to time.    Social Determinants of Health   Financial Resource Strain: Not on file  Food Insecurity: Not on file  Transportation Needs: Not on file  Physical Activity: Not on file  Stress: Not on file  Social Connections: Not on file   Intimate Partner Violence: Not on file    Allergies:  No Known Allergies  Medications: Prior to Admission medications   Medication Sig Start Date End Date Taking? Authorizing Provider  levonorgestrel (MIRENA) 20 MCG/24HR IUD 1 each by Intrauterine route once.   Yes [facility], [place]    Physical Exam Vitals: Blood pressure 126/80, height 5\' 5"  (1.651 m), weight 231 lb (104.8 kg). Body mass index is 38.44 kg/m.  General: NAD HEENT: normocephalic, anicteric Thyroid: no enlargement, no palpable nodules Pulmonary: No increased work of breathing, CTAB Cardiovascular: RRR, distal pulses 2+ Breast: Breast symmetrical, no tenderness, no palpable nodules or masses, no skin or nipple retraction present, no nipple discharge.  No axillary or supraclavicular lymphadenopathy. Abdomen: NABS, soft, non-tender, non-distended.  Umbilicus without lesions.  No hepatomegaly, splenomegaly or masses palpable. No evidence of hernia  Genitourinary:  External: Normal external female genitalia.  Normal urethral meatus, normal Bartholin's and Skene's glands.    Vagina: Normal vaginal mucosa, no evidence of prolapse.    Cervix: Grossly normal in appearance, no bleeding  Uterus: Non-enlarged, mobile, normal contour.  No CMT  Adnexa: ovaries non-enlarged, no adnexal masses  Rectal: deferred  Lymphatic: no evidence of inguinal lymphadenopathy Extremities: no edema, erythema, or tenderness Neurologic: Grossly intact Psychiatric: mood appropriate, affect full  Female chaperone present for pelvic and breast  portions of the physical exam  GAD 7 : Generalized Anxiety Score 12/29/2018 12/01/2018  Nervous, Anxious, on Edge 1 2  Control/stop worrying 0 2  Worry too much - different things 1 2  Trouble relaxing 0 2  Restless 0 0  Easily annoyed or irritable 1 3  Afraid - awful might happen 0 0  Total GAD 7 Score 3 11  Anxiety Difficulty Not difficult at all Somewhat difficult    Depression screen  [facility] 2/9 12/29/2018 12/01/2018 09/21/2015 09/21/2015  Decreased Interest 1 1 0 0  Down, Depressed, Hopeless 1 2 0 0  PHQ - 2 Score 2 3 0 0  Altered sleeping 0 1 0 -  Tired, decreased energy 1 3 3  -  Change in appetite 1 1 0 -  Feeling bad or failure about yourself  1 1 2  -  Trouble concentrating 0 0 1 -  Moving slowly or fidgety/restless 0 0 0 -  Suicidal thoughts 0 0 0 -  PHQ-9 Score 5 9 6  -  Difficult doing work/chores Not difficult at all - - -     Assessment: 35 y.o. routine annual exam  Plan: Problem List Items Addressed This Visit   None Visit Diagnoses     Encounter for gynecological examination without abnormal finding    -  Primary   Screening for malignant neoplasm of cervix       Relevant Orders   Cytology - PAP   Breast screening       Family history of diabetes mellitus type II       Relevant Orders  TSH   Hemoglobin A1c   Class 2 obesity without serious comorbidity with body mass index (BMI) of 38.0 to 38.9 in adult, unspecified obesity type       Relevant Orders   TSH   Hemoglobin A1c   Generalized anxiety disorder       Relevant Medications   buPROPion (WELLBUTRIN XL) 150 MG 24 hr tablet   Other Relevant Orders   CBC   TSH   B12   Hemoglobin A1c      Annual 1) STI screening  was notoffered and therefore not obtained  2)  ASCCP guidelines and rational discussed.  Patient opts for every 3 years screening interval  3) Contraception - the patient is currently using  IUD.  She is happy with her current form of contraception and plans to continue  4) Routine healthcare maintenance including cholesterol, diabetes screening discussed managed by PCP   5) Return in about 2 weeks (around 09/23/2020) for 2-4 week medication follow up in person or phone.  Anxiety/Depression  1) Start Wellbutrin XL 150mg  po daily  2) CBC, TSH, B12 checked  3) Assess treatment response in 2 weeks   , MD, [name] [facility], [facility] 09/09/2020, 2:40 PM

## 2020-09-10 LAB — CBC
Hematocrit: 41.1 % (ref 34.0–46.6)
Hemoglobin: 14 g/dL (ref 11.1–15.9)
MCH: 31.2 pg (ref 26.6–33.0)
MCHC: 34.1 g/dL (ref 31.5–35.7)
MCV: 92 fL (ref 79–97)
Platelets: 263 10*3/uL (ref 150–450)
RBC: 4.49 x10E6/uL (ref 3.77–5.28)
RDW: 12.3 % (ref 11.7–15.4)
WBC: 8.7 10*3/uL (ref 3.4–10.8)

## 2020-09-10 LAB — HEMOGLOBIN A1C
Est. average glucose Bld gHb Est-mCnc: 114 mg/dL
Hgb A1c MFr Bld: 5.6 % (ref 4.8–5.6)

## 2020-09-10 LAB — TSH: TSH: 2.61 u[IU]/mL (ref 0.450–4.500)

## 2020-09-10 LAB — VITAMIN B12: Vitamin B-12: 370 pg/mL (ref 232–1245)

## 2020-09-16 LAB — CYTOLOGY - PAP
Comment: NEGATIVE
Diagnosis: NEGATIVE
High risk HPV: NEGATIVE

## 2020-09-19 ENCOUNTER — Other Ambulatory Visit: Payer: Self-pay | Admitting: Obstetrics and Gynecology

## 2020-09-29 ENCOUNTER — Ambulatory Visit: Payer: 59 | Admitting: Obstetrics and Gynecology

## 2020-10-13 ENCOUNTER — Ambulatory Visit (INDEPENDENT_AMBULATORY_CARE_PROVIDER_SITE_OTHER): Payer: 59 | Admitting: Obstetrics and Gynecology

## 2020-10-13 ENCOUNTER — Encounter: Payer: Self-pay | Admitting: Obstetrics and Gynecology

## 2020-10-13 ENCOUNTER — Other Ambulatory Visit: Payer: Self-pay

## 2020-10-13 DIAGNOSIS — F419 Anxiety disorder, unspecified: Secondary | ICD-10-CM

## 2020-10-13 DIAGNOSIS — F32A Depression, unspecified: Secondary | ICD-10-CM

## 2020-10-13 MED ORDER — BUPROPION HCL ER (XL) 300 MG PO TB24: 300.0000 mg | ORAL_TABLET | Freq: Every day | ORAL | 2 refills | Status: DC

## 2020-10-13 NOTE — Progress Notes (Signed)
Obstetrics & Gynecology Office Visit   Chief Complaint:  Chief Complaint  Patient presents with  . Follow-up    Phone visit - medication F/U, no concerns.     History of Present Illness: The patient is a 35 y.o. female presenting follow up for symptoms of anxiety and depression.  The patient is currently taking WellbutrinXL 150mg  for the management of her symptoms.  She has not had any recent situational stressors.  She reports symptoms of anhedonia, social anxiety, feelings of guilt, and feelings of worthlessness.  She denies risk taking behavior, agorophobia, suicidal ideation, homicidal ideation, auditory hallucinations, and visual hallucinations. Symptoms have improved since last visit.     The patient does have a pre-existing history of depression and anxiety.  She  does not a prior history of suicide attempts.    Review of Systems: Review of Systems  Constitutional: Negative.   Gastrointestinal:  Negative for nausea.  Neurological:  Negative for headaches.  Psychiatric/Behavioral:  Positive for depression. Negative for hallucinations, memory loss, substance abuse and suicidal ideas. The patient is nervous/anxious. The patient does not have insomnia.     Past Medical History:  Past Medical History:  Diagnosis Date  . Chronic headaches   . Depression   . Genital herpes 2012  . Left elbow fracture 09/2014  . Ovarian cyst     Past Surgical History:  Past Surgical History:  Procedure Laterality Date  . DIAGNOSTIC LAPAROSCOPY  2012   IUD removal in intra-abdominal wall  ([facility])  . GALLBLADDER SURGERY      Gynecologic History: No LMP recorded. (Menstrual status: IUD).  Obstetric History: 2013  Family History:  Family History  Problem Relation Age of Onset  . Depression Mother   . Heart disease Father   . Diabetes Father   . Diabetes Maternal Aunt   . Breast cancer Maternal Aunt 70  . Breast cancer Maternal Aunt 50  . Colon cancer Maternal Grandfather    . Diabetes Paternal Grandmother     Social History:  Social History   Socioeconomic History  . Marital status: Married    Spouse name: Not on file  . Number of children: Not on file  . Years of education: Not on file  . Highest education level: Not on file  Occupational History  . Not on file  Tobacco Use  . Smoking status: Never  . Smokeless tobacco: Never  Vaping Use  . Vaping Use: Never used  Substance and Sexual Activity  . Alcohol use: Yes    Comment: Occasional  . Drug use: Never  . Sexual activity: Yes    Birth control/protection: I.U.D.    Comment: Mirena  Other Topics Concern  . Not on file  Social History Narrative   Works at [id number].   Lives [place] with husband and 3 children -11,8,3.      Diet- Has improved.    Exercise- Not right now, not exercising due to time.    Social Determinants of Health   Financial Resource Strain: Not on file  Food Insecurity: Not on file  Transportation Needs: Not on file  Physical Activity: Not on file  Stress: Not on file  Social Connections: Not on file  Intimate Partner Violence: Not on file    Allergies:  No Known Allergies  Medications: Prior to Admission medications   Medication Sig Start Date End Date Taking? Authorizing Provider  buPROPion (WELLBUTRIN XL) 150 MG 24 hr tablet Take 1 tablet (150 mg total)  by mouth daily. 09/09/20  Yes [name], MD  levonorgestrel (MIRENA) 20 MCG/24HR IUD 1 each by Intrauterine route once.    [facility], [place]    Physical Exam Vitals: There were no vitals filed for this visit. No LMP recorded. (Menstrual status: IUD).  No physical exam as this was a remote telephone visit to promote social distancing during the current COVID-19 Pandemic    GAD 7 : Generalized Anxiety Score 10/13/2020 12/29/2018 12/01/2018  Nervous, Anxious, on Edge 1 1 2   Control/stop worrying 0 0 2  Worry too much - different things 1 1 2   Trouble relaxing 1 0 2   Restless 0 0 0  Easily annoyed or irritable 3 1 3   Afraid - awful might happen 1 0 0  Total GAD 7 Score 7 3 11   Anxiety Difficulty Somewhat difficult Not difficult at all Somewhat difficult    Depression screen [facility] 2/9 10/13/2020 12/29/2018 12/01/2018  Decreased Interest 3 1 1   Down, Depressed, Hopeless 2 1 2   PHQ - 2 Score 5 2 3   Altered sleeping 0 0 1  Tired, decreased energy 3 1 3   Change in appetite 0 1 1  Feeling bad or failure about yourself  3 1 1   Trouble concentrating 2 0 0  Moving slowly or fidgety/restless 0 0 0  Suicidal thoughts 1 0 0  PHQ-9 Score 14 5 9   Difficult doing work/chores Somewhat difficult Not difficult at all -    Depression screen [facility] 2/9 10/13/2020 12/29/2018 12/01/2018 09/21/2015 09/21/2015  Decreased Interest 3 1 1  0 0  Down, Depressed, Hopeless 2 1 2  0 0  PHQ - 2 Score 5 2 3  0 0  Altered sleeping 0 0 1 0 -  Tired, decreased energy 3 1 3 3  -  Change in appetite 0 1 1 0 -  Feeling bad or failure about yourself  3 1 1 2  -  Trouble concentrating 2 0 0 1 -  Moving slowly or fidgety/restless 0 0 0 0 -  Suicidal thoughts 1 0 0 0 -  PHQ-9 Score 14 5 9 6  -  Difficult doing work/chores Somewhat difficult Not difficult at all - - -     Assessment: 35 y.o. anxiety and depression  Plan: Problem List Items Addressed This Visit   None Visit Diagnoses     Anxiety and depression    -  Primary   Relevant Medications   buPROPion (WELLBUTRIN XL) 300 MG 24 hr tablet       1) Anxiety/Depression - overwhelmed and despair feeling is most bothersome feeling to patient still. Overall feels symptoms have improved.  Able to contract for safety.   - increased Wellbutrin XL to 300mg    2) Thyroid and B12 screen has been obtained previously  3) Telephone time 6:70min  4) Return in about 2 weeks (around 10/27/2020) for 2-4 week medication follow up phone.   4/9, MD, 12/13/2020 [facility], [facility] 10/13/2020, 8:23  AM

## 2020-11-03 ENCOUNTER — Ambulatory Visit: Payer: 59 | Admitting: Obstetrics and Gynecology

## 2020-11-17 ENCOUNTER — Ambulatory Visit: Payer: 59 | Admitting: Obstetrics and Gynecology

## 2020-12-15 ENCOUNTER — Other Ambulatory Visit: Payer: Self-pay

## 2020-12-15 ENCOUNTER — Ambulatory Visit (INDEPENDENT_AMBULATORY_CARE_PROVIDER_SITE_OTHER): Payer: 59 | Admitting: Obstetrics and Gynecology

## 2020-12-15 DIAGNOSIS — F419 Anxiety disorder, unspecified: Secondary | ICD-10-CM

## 2020-12-15 DIAGNOSIS — F32A Depression, unspecified: Secondary | ICD-10-CM | POA: Diagnosis not present

## 2020-12-15 MED ORDER — BUSPIRONE HCL 7.5 MG PO TABS: 7.5000 mg | ORAL_TABLET | Freq: Two times a day (BID) | ORAL | 3 refills | Status: DC

## 2020-12-15 MED ORDER — BUPROPION HCL ER (XL) 300 MG PO TB24: 300.0000 mg | ORAL_TABLET | Freq: Every day | ORAL | 3 refills | Status: DC

## 2020-12-15 NOTE — Progress Notes (Signed)
I connected with Judy Lozano on 12/15/20 at  9:50 AM EDT by telephone and verified that I am speaking with the correct person using two identifiers.   I discussed the limitations, risks, security and privacy concerns of performing an evaluation and management service by telephone and the availability of in person appointments. I also discussed with the patient that there may be a patient responsible charge related to this service. The patient expressed understanding and agreed to proceed.  The patient was at home I spoke with the patient from my workstation phone The names of people involved in this encounter were: Judy Lozano , and Judy Lozano   Obstetrics & Gynecology Office Visit   Chief Complaint:  Chief Complaint  Patient presents with   Follow-up    Phone visit 4 wk medication - no concerns.    History of Present Illness: The patient is a 35 y.o. female presenting follow up for symptoms of anxiety and depression.  The patient is currently taking Wellbutrin XL 300mg  daily and Buspar 7.5mg  po bid for the management of her symptoms.  She has not had any recent situational stressors.  She reports symptoms of irritability and social anxiety.  She denies anhedonia, day time somnolence, insomnia, risk taking behavior, irritability, feelings of guilt, feelings of worthlessness, suicidal ideation, homicidal ideation, auditory hallucinations, and visual hallucinations. Symptoms have improved since last visit.      Review of Systems: Review of Systems  Constitutional: Negative.   Gastrointestinal: Negative.   Genitourinary: Negative.   Psychiatric/Behavioral:  Negative for depression, hallucinations, memory loss, substance abuse and suicidal ideas. The patient is nervous/anxious.     Past Medical History:  Past Medical History:  Diagnosis Date   Chronic headaches    Depression    Genital herpes 2012   Left elbow fracture 09/2014   Ovarian cyst     Past Surgical  History:  Past Surgical History:  Procedure Laterality Date   DIAGNOSTIC LAPAROSCOPY  2012   IUD removal in intra-abdominal wall  ([facility])   GALLBLADDER SURGERY      Gynecologic History: No LMP recorded. (Menstrual status: IUD).  Obstetric History: 2013  Family History:  Family History  Problem Relation Age of Onset   Depression Mother    Heart disease Father    Diabetes Father    Diabetes Maternal Aunt    Breast cancer Maternal Aunt 65   Breast cancer Maternal Aunt 54   Colon cancer Maternal Grandfather    Diabetes Paternal Grandmother     Social History:  Social History   Socioeconomic History   Marital status: Married    Spouse name: Not on file   Number of children: Not on file   Years of education: Not on file   Highest education level: Not on file  Occupational History   Not on file  Tobacco Use   Smoking status: Never   Smokeless tobacco: Never  Vaping Use   Vaping Use: Never used  Substance and Sexual Activity   Alcohol use: Yes    Comment: Occasional   Drug use: Never   Sexual activity: Yes    Birth control/protection: I.U.D.    Comment: Mirena  Other Topics Concern   Not on file  Social History Narrative   Works at [employer].   Lives [place] with husband and 3 children -11,8,3.      Diet- Has improved.    Exercise- Not right now, not exercising due to time.  Social Determinants of Health   Financial Resource Strain: Not on file  Food Insecurity: Not on file  Transportation Needs: Not on file  Physical Activity: Not on file  Stress: Not on file  Social Connections: Not on file  Intimate Partner Violence: Not on file    Allergies:  No Known Allergies  Medications: Prior to Admission medications   Medication Sig Start Date End Date Taking? Authorizing Provider  buPROPion (WELLBUTRIN XL) 300 MG 24 hr tablet Take 1 tablet (300 mg total) by mouth daily. 10/13/20  Yes Judy Austria, MD  levonorgestrel (MIRENA) 20  MCG/24HR IUD 1 each by Intrauterine route once.    [facility], [place]    Physical Exam Vitals: There were no vitals filed for this visit. No LMP recorded. (Menstrual status: IUD).  No physical exam as this was a remote telephone visit to promote social distancing during the current COVID-19 Pandemic   GAD 7 : Generalized Anxiety Score 12/15/2020 10/13/2020 12/29/2018 12/01/2018  Nervous, Anxious, on Edge 2 1 1 2   Control/stop worrying 1 0 0 2  Worry too much - different things 2 1 1 2   Trouble relaxing 1 1 0 2  Restless 0 0 0 0  Easily annoyed or irritable 3 3 1 3   Afraid - awful might happen 2 1 0 0  Total GAD 7 Score 11 7 3 11   Anxiety Difficulty Somewhat difficult Somewhat difficult Not difficult at all Somewhat difficult    Depression screen [facility] 2/9 12/15/2020 10/13/2020 12/29/2018  Decreased Interest 1 3 1   Down, Depressed, Hopeless 1 2 1   PHQ - 2 Score 2 5 2   Altered sleeping 0 0 0  Tired, decreased energy 1 3 1   Change in appetite 1 0 1  Feeling bad or failure about yourself  2 3 1   Trouble concentrating 2 2 0  Moving slowly or fidgety/restless 0 0 0  Suicidal thoughts 0 1 0  PHQ-9 Score 8 14 5   Difficult doing work/chores Not difficult at all Somewhat difficult Not difficult at all    Depression screen [facility] 2/9 12/15/2020 10/13/2020 12/29/2018 12/01/2018 09/21/2015  Decreased Interest 1 3 1 1  0  Down, Depressed, Hopeless 1 2 1 2  0  PHQ - 2 Score 2 5 2 3  0  Altered sleeping 0 0 0 1 0  Tired, decreased energy 1 3 1 3 3   Change in appetite 1 0 1 1 0  Feeling bad or failure about yourself  2 3 1 1 2   Trouble concentrating 2 2 0 0 1  Moving slowly or fidgety/restless 0 0 0 0 0  Suicidal thoughts 0 1 0 0 0  PHQ-9 Score 8 14 5 9 6   Difficult doing work/chores Not difficult at all Somewhat difficult Not difficult at all - -     Assessment: 35 y.o. follow up anxiety/depression  Plan: Problem List Items Addressed This Visit   None Visit Diagnoses      Anxiety and depression    -  Primary   Relevant Medications   busPIRone (BUSPAR) 7.5 MG tablet   buPROPion (WELLBUTRIN XL) 300 MG 24 hr tablet       1) Anxiety/Depression -- good response overall per patient.  On scaling PHQ-9 showing improvement but GAD7 showing some increase. - continue Welbutrin XL 300mg  - add buspar 7.5mg  po bid to see if any further improvement in GAD-7  2) Thyroid and B12 screen has been obtained previously  3) Telephone time 7:26min  4)  Return in about 6 weeks (around 01/26/2021) for medication .   Judy Austria, MD, [facility], [facility] 12/15/2020, 10:08 AM

## 2021-01-26 ENCOUNTER — Other Ambulatory Visit: Payer: Self-pay

## 2021-01-26 ENCOUNTER — Ambulatory Visit (INDEPENDENT_AMBULATORY_CARE_PROVIDER_SITE_OTHER): Payer: 59 | Admitting: Obstetrics and Gynecology

## 2021-01-26 DIAGNOSIS — F419 Anxiety disorder, unspecified: Secondary | ICD-10-CM

## 2021-01-26 DIAGNOSIS — F32A Depression, unspecified: Secondary | ICD-10-CM | POA: Diagnosis not present

## 2021-01-26 MED ORDER — BUSPIRONE HCL 7.5 MG PO TABS: 7.5000 mg | ORAL_TABLET | Freq: Two times a day (BID) | ORAL | 3 refills | Status: DC

## 2021-01-26 NOTE — Progress Notes (Signed)
I connected with Judy Lozano on 01/26/21 at  8:50 AM EST by telephone and verified that I am speaking with the correct person using two identifiers.   I discussed the limitations, risks, security and privacy concerns of performing an evaluation and management service by telephone and the availability of in person appointments. I also discussed with the patient that there may be a patient responsible charge related to this service. The patient expressed understanding and agreed to proceed.  The patient was at home I spoke with the patient from my workstation phone The names of people involved in this encounter were: Judy Lozano , and Judy Lozano   Obstetrics & Gynecology Office Visit   Chief Complaint:  Chief Complaint  Patient presents with   Follow-up    Phone visit - follow medication, no concerns.    History of Present Illness: The patient is a 35 y.o. female presenting follow up for symptoms of anxiety.  The patient is currently taking Wellbutrin XL 300mg  and Busapr 7.5mg  for the management of her symptoms.  She has not had any recent situational stressors.  She reports good improvement in symptoms on current regimen.  She denies anhedonia, day time somnolence, insomnia, risk taking behavior, irritability, social anxiety, agorophobia, feelings of guilt, feelings of worthlessness, suicidal ideation, homicidal ideation, auditory hallucinations, and visual hallucinations. Symptoms have improved since last visit.     Review of Systems: Review of Systems  Constitutional: Negative.   Gastrointestinal: Negative.   Neurological:  Negative for headaches.  Psychiatric/Behavioral: Negative.      Past Medical History:  Past Medical History:  Diagnosis Date   Chronic headaches    Depression    Genital herpes 2012   Left elbow fracture 09/2014   Ovarian cyst     Past Surgical History:  Past Surgical History:  Procedure Laterality Date   DIAGNOSTIC LAPAROSCOPY   2012   IUD removal in intra-abdominal wall  ([facility])   GALLBLADDER SURGERY      Gynecologic History: No LMP recorded. (Menstrual status: IUD).  Obstetric History: 2013  Family History:  Family History  Problem Relation Age of Onset   Depression Mother    Heart disease Father    Diabetes Father    Diabetes Maternal Aunt    Breast cancer Maternal Aunt 42   Breast cancer Maternal Aunt 8   Colon cancer Maternal Grandfather    Diabetes Paternal Grandmother     Social History:  Social History   Socioeconomic History   Marital status: Married    Spouse name: Not on file   Number of children: Not on file   Years of education: Not on file   Highest education level: Not on file  Occupational History   Not on file  Tobacco Use   Smoking status: Never   Smokeless tobacco: Never  Vaping Use   Vaping Use: Never used  Substance and Sexual Activity   Alcohol use: Yes    Comment: Occasional   Drug use: Never   Sexual activity: Yes    Birth control/protection: I.U.D.    Comment: Mirena  Other Topics Concern   Not on file  Social History Narrative   Works at [employer].   Lives [place] with husband and 3 children -11,8,3.      Diet- Has improved.    Exercise- Not right now, not exercising due to time.    Social Determinants of Health   Financial Resource Strain: Not on file  Food Insecurity:  Not on file  Transportation Needs: Not on file  Physical Activity: Not on file  Stress: Not on file  Social Connections: Not on file  Intimate Partner Violence: Not on file    Allergies:  No Known Allergies  Medications: Prior to Admission medications   Medication Sig Start Date End Date Taking? Authorizing Provider  buPROPion (WELLBUTRIN XL) 300 MG 24 hr tablet Take 1 tablet (300 mg total) by mouth daily. 12/15/20  Yes Judy Austria, MD  busPIRone (BUSPAR) 7.5 MG tablet Take 1 tablet (7.5 mg total) by mouth 2 (two) times daily. 12/15/20  Yes Judy Austria, MD  levonorgestrel (MIRENA) 20 MCG/24HR IUD 1 each by Intrauterine route once.    [facility], [place]    Physical Exam Vitals: There were no vitals filed for this visit. No LMP recorded. (Menstrual status: IUD).  No physical exam as this was a remote telephone visit to promote social distancing during the current COVID-19 Pandemic   GAD 7 : Generalized Anxiety Score 01/26/2021 12/15/2020 10/13/2020 12/29/2018  Nervous, Anxious, on Edge 1 2 1 1   Control/stop worrying 1 1 0 0  Worry too much - different things 0 2 1 1   Trouble relaxing 0 1 1 0  Restless 0 0 0 0  Easily annoyed or irritable 1 3 3 1   Afraid - awful might happen 1 2 1  0  Total GAD 7 Score 4 11 7 3   Anxiety Difficulty Not difficult at all Somewhat difficult Somewhat difficult Not difficult at all    Depression screen [facility] 2/9 01/26/2021 12/15/2020 10/13/2020  Decreased Interest 0 1 3  Down, Depressed, Hopeless 0 1 2  PHQ - 2 Score 0 2 5  Altered sleeping 0 0 0  Tired, decreased energy 1 1 3   Change in appetite 0 1 0  Feeling bad or failure about yourself  1 2 3   Trouble concentrating 3 2 2   Moving slowly or fidgety/restless 0 0 0  Suicidal thoughts 0 0 1  PHQ-9 Score 5 8 14   Difficult doing work/chores Somewhat difficult Not difficult at all Somewhat difficult    Depression screen [facility] 2/9 01/26/2021 12/15/2020 10/13/2020 12/29/2018 12/01/2018  Decreased Interest 0 1 3 1 1   Down, Depressed, Hopeless 0 1 2 1 2   PHQ - 2 Score 0 2 5 2 3   Altered sleeping 0 0 0 0 1  Tired, decreased energy 1 1 3 1 3   Change in appetite 0 1 0 1 1  Feeling bad or failure about yourself  1 2 3 1 1   Trouble concentrating 3 2 2  0 0  Moving slowly or fidgety/restless 0 0 0 0 0  Suicidal thoughts 0 0 1 0 0  PHQ-9 Score 5 8 14 5 9   Difficult doing work/chores Somewhat difficult Not difficult at all Somewhat difficult Not difficult at all -     Assessment: 35 y.o. follow up anxiety/depression  Plan: Problem List  Items Addressed This Visit   None Visit Diagnoses     Anxiety and depression    -  Primary   Relevant Medications   busPIRone (BUSPAR) 7.5 MG tablet       1) Contineu Wellbutrin XL 300mg  and Buspar 7.5mg  po bid  2) Thyroid and B12 screen has been obtained previously  3) Telephone time 5:58min  4) Return in about 7 months (around 08/26/2021) for annual.    01/28/2021, MD, 13/04/2020 OB/GYN, [facility] 01/26/2021, 9:01 AM

## 2021-11-13 IMAGING — CT CT HEAD W/O CM
3 series · 16 of 47 positions shown, 19 images · non-contrast
Comparison: None.

CLINICAL DATA: Headache

EXAM:
CT HEAD WITHOUT CONTRAST
TECHNIQUE: Contiguous axial images were obtained from the base of the skull
through the vertex without intravenous contrast.

[Series 2: head wo · axial · 0.42mm/px · z∈[+1586,+1711]mm · 10 of 30 slices shown, 13 images]
[im 3/30  brain]
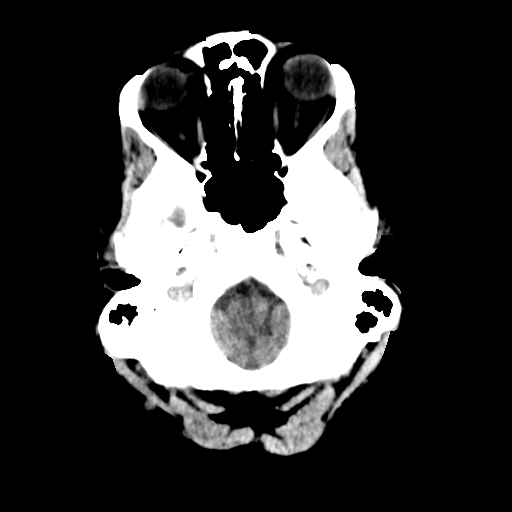
[im 3/30  bone]
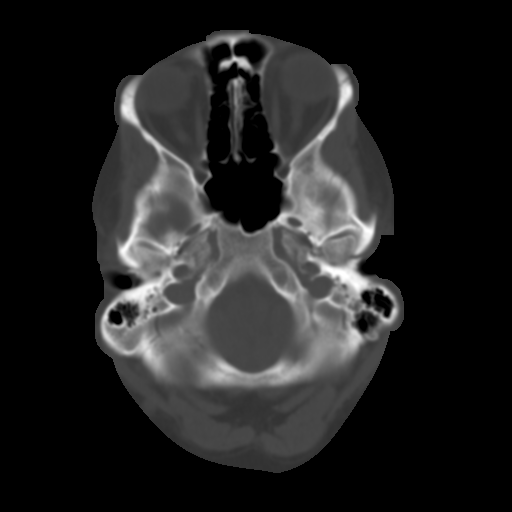
[im 6/30  brain]
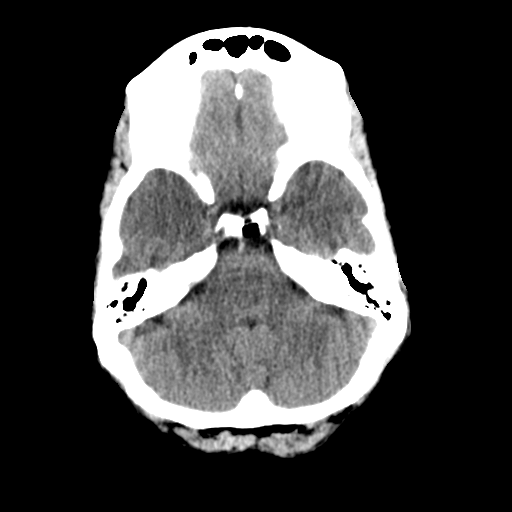
[im 9/30  brain]
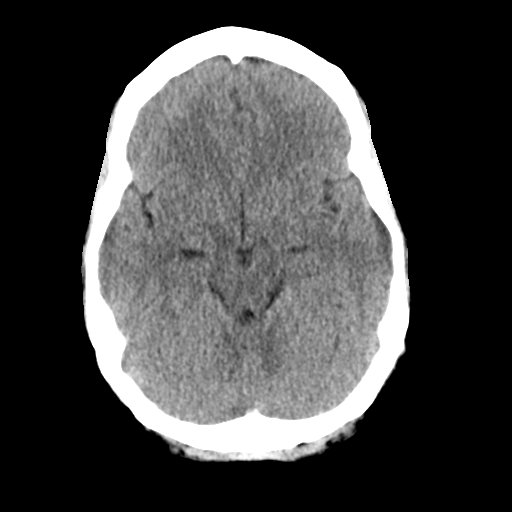
[im 11/30  brain]
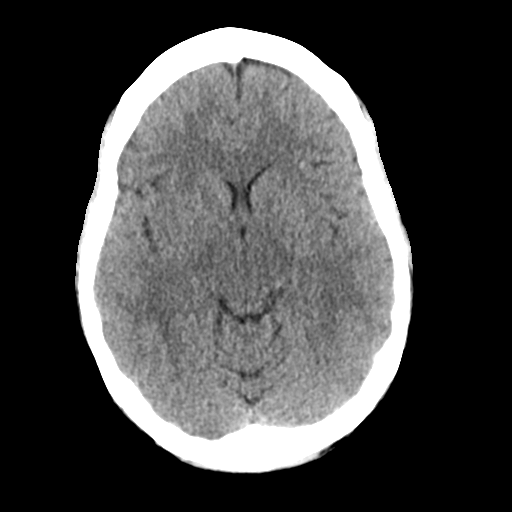
[im 14/30  brain]
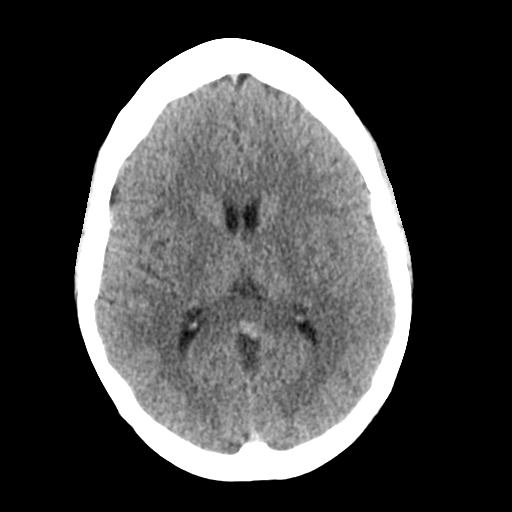
[im 14/30  bone]
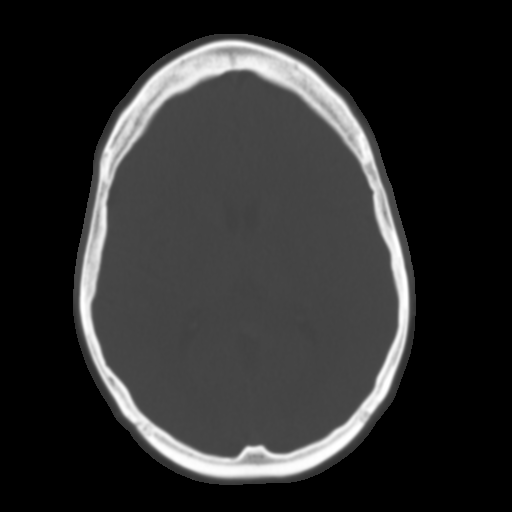
[im 17/30  brain]
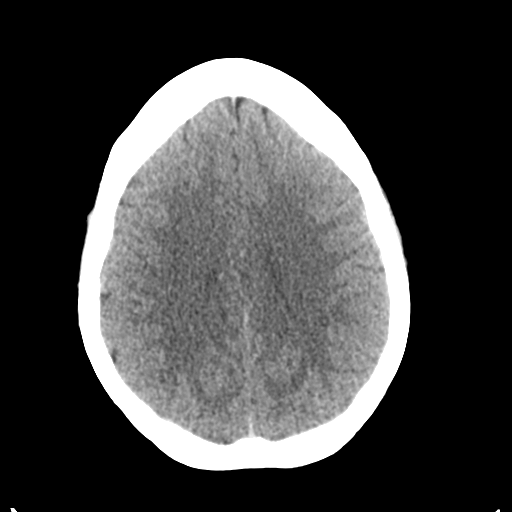
[im 20/30  brain]
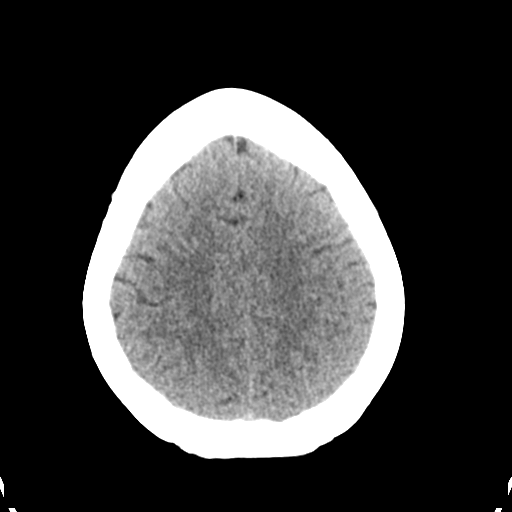
[im 23/30  brain]
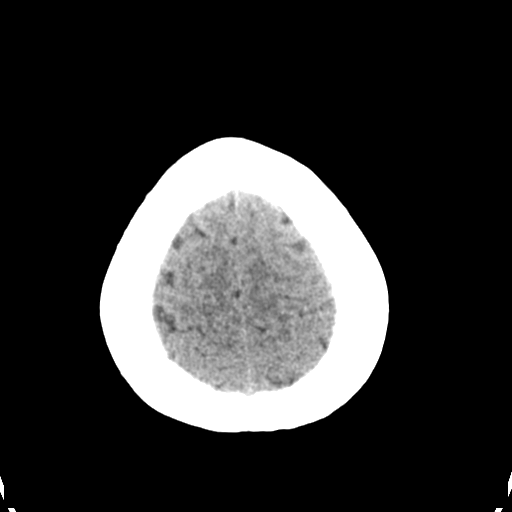
[im 25/30  brain]
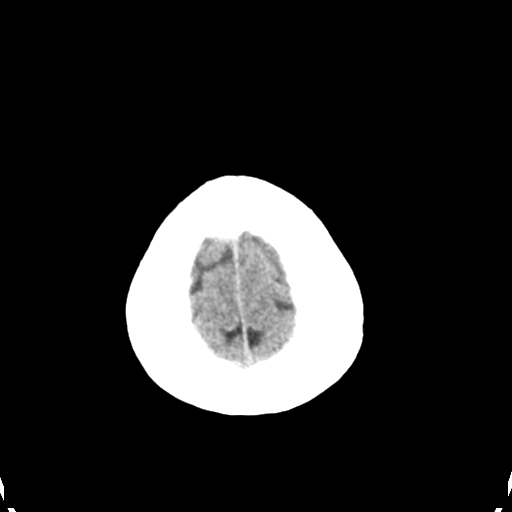
[im 25/30  bone]
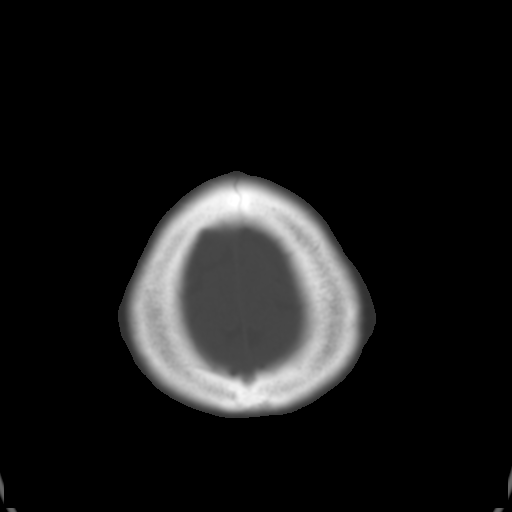
[im 28/30  brain]
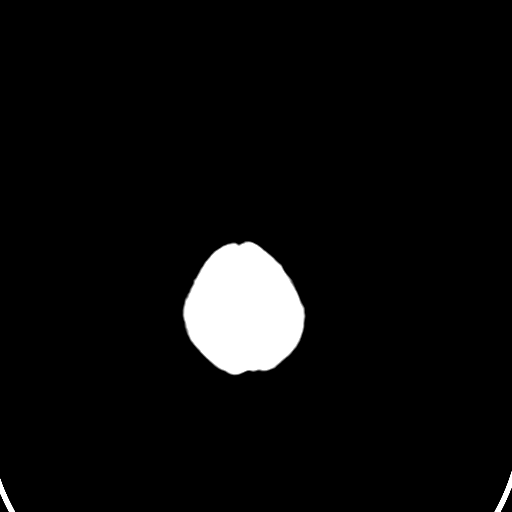

[Series 4: coronal soft tissue · coronal · 0.31mm/px · 3 of 67 slices shown]
[im 23/67  brain]
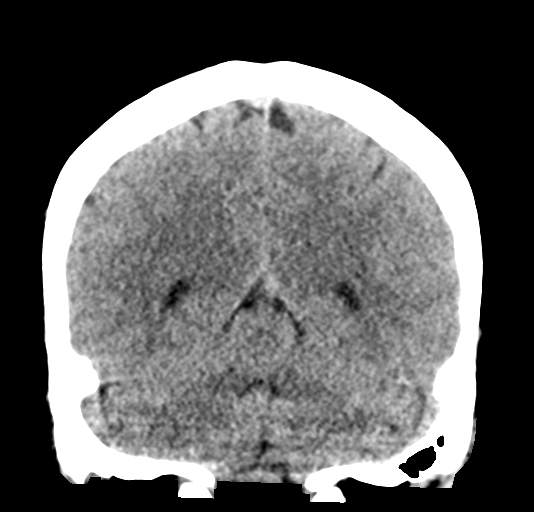
[im 30/67  brain]
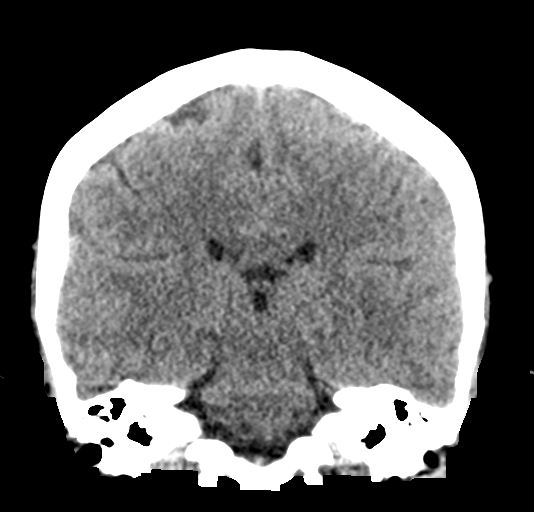
[im 37/67  brain]
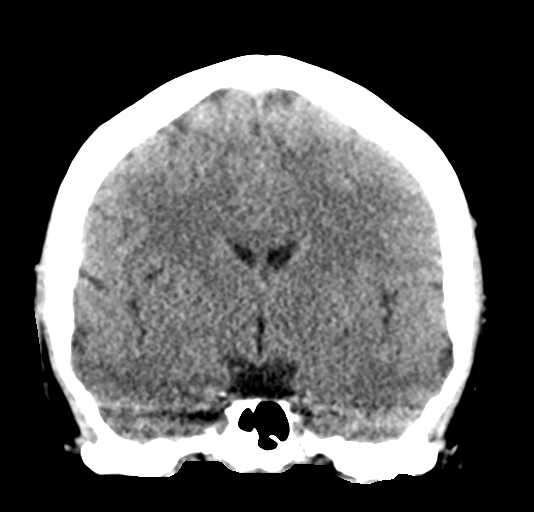

[Series 5: sagittal soft tissue · sagittal · 0.31mm/px · 3 of 55 slices shown]
[im 19/55  brain]
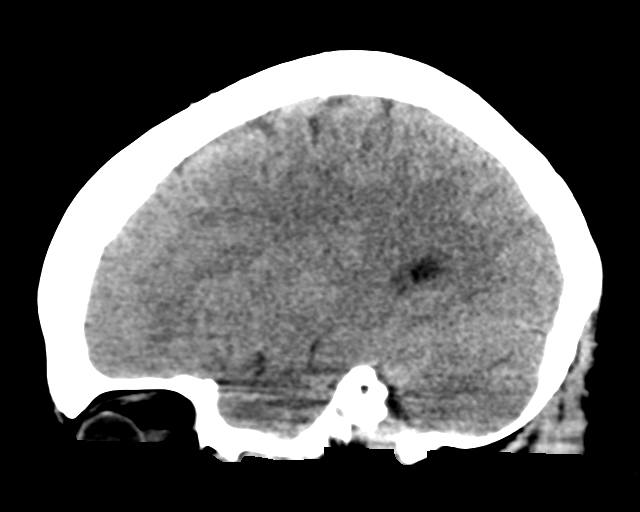
[im 28/55  brain]
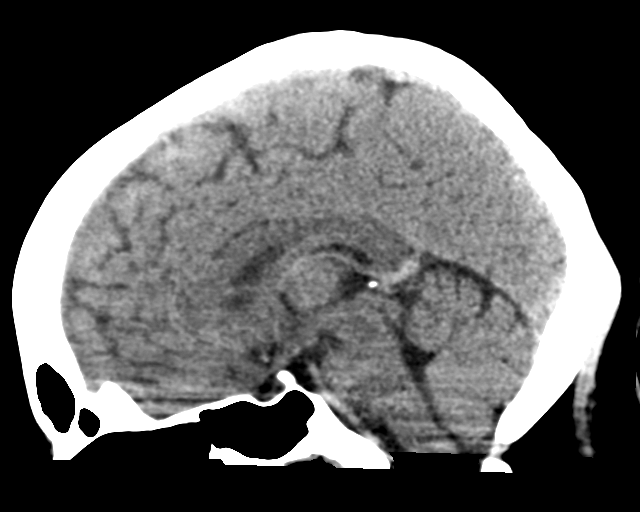
[im 37/55  brain]
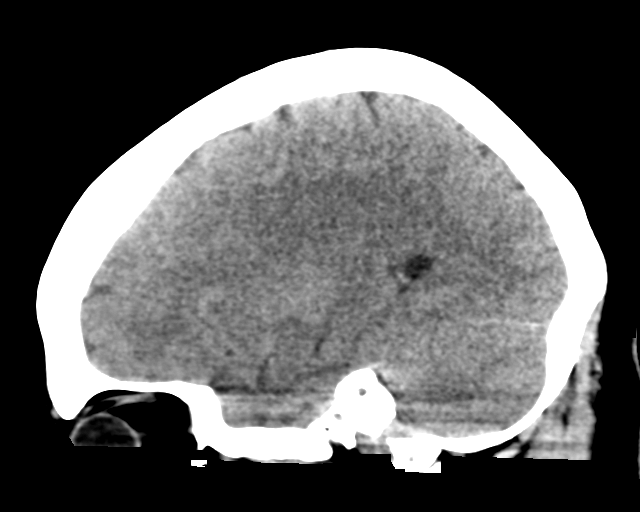

[16 of 47 positions shown; findings below may reference images not displayed]

FINDINGS: Brain: There is no acute intracranial hemorrhage, mass effect, or
edema. Gray-white differentiation is preserved. There is no
extra-axial fluid collection. Ventricles and sulci are within normal
limits in size and configuration.

Vascular: No hyperdense vessel or unexpected calcification.

Skull: Calvarium is unremarkable.

Sinuses/Orbits: No acute finding.

Other: None.
IMPRESSION: No acute intracranial abnormality.

## 2022-03-01 ENCOUNTER — Ambulatory Visit: Payer: 59 | Admitting: Obstetrics and Gynecology

## 2022-03-01 NOTE — Progress Notes (Deleted)
PCP:  Burnard Hawthorne, FNP   No chief complaint on file.    HPI:      Ms. Judy Lozano is a 37 y.o. [phone] whose LMP was No LMP recorded. (Menstrual status: IUD)., presents today for her annual examination.  Her menses are absent due to IUD  Sex activity: {sex active: 315163}. Mirena placed 11/23/16 Last Pap: 09/09/20 Results were: no abnormalities /neg HPV DNA  Hx of STDs: {STD hx:14358}  There is a FH of breast cancer in 2 mat aunts, gnetic testing ***  There is no FH of ovarian cancer. The patient {does:18564} do self-breast exams.  Tobacco use: {tob:20664} Alcohol use: {Alcohol:11675} No drug use.  Exercise: {exercise:31265}  She {does:18564} get adequate calcium and Vitamin D in her diet.  Patient Active Problem List   Diagnosis Date Noted   Depression 09/21/2015   Routine general medical examination at a health care facility 09/21/2015    Past Surgical History:  Procedure Laterality Date   DIAGNOSTIC LAPAROSCOPY  2012   IUD removal in intra-abdominal wall  ([facility])   GALLBLADDER SURGERY      Family History  Problem Relation Age of Onset   Depression Mother    Heart disease Father    Diabetes Father    Diabetes Maternal Aunt    Breast cancer Maternal Aunt 70   Breast cancer Maternal Aunt 50   Colon cancer Maternal Grandfather    Diabetes Paternal Grandmother     Social History   Socioeconomic History   Marital status: Married    Spouse name: Not on file   Number of children: Not on file   Years of education: Not on file   Highest education level: Not on file  Occupational History   Not on file  Tobacco Use   Smoking status: Never   Smokeless tobacco: Never  Vaping Use   Vaping Use: Never used  Substance and Sexual Activity   Alcohol use: Yes    Comment: Occasional   Drug use: Never   Sexual activity: Yes    Birth control/protection: I.U.D.    Comment: Mirena  Other Topics Concern   Not on file  Social History Narrative    Works at [employer]ist [employer].   Lives [place] with husband and 3 children -11,8,3.      Diet- Has improved.    Exercise- Not right now, not exercising due to time.    Social Determinants of Health   Financial Resource Strain: Not on file  Food Insecurity: Not on file  Transportation Needs: Not on file  Physical Activity: Not on file  Stress: Not on file  Social Connections: Not on file  Intimate Partner Violence: Not on file     Current Outpatient Medications:    buPROPion (WELLBUTRIN XL) 300 MG 24 hr tablet, Take 1 tablet (300 mg total) by mouth daily., Disp: 90 tablet, Rfl: 3   busPIRone (BUSPAR) 7.5 MG tablet, Take 1 tablet (7.5 mg total) by mouth 2 (two) times daily., Disp: 180 tablet, Rfl: 3   levonorgestrel (MIRENA) 20 MCG/24HR IUD, 1 each by Intrauterine route once., Disp: , Rfl:      ROS:  Review of Systems BREAST: No symptoms   Objective: There were no vitals taken for this visit.   OBGyn Exam  Results: No results found for this or any previous visit (from the past 24 hour(s)).  Assessment/Plan: No diagnosis found.  No orders of the defined types were placed in this encounter.  GYN counsel {counseling: 16159}     F/U  No follow-ups on file.  Deija Buhrman B. Tashiba Timoney, PA-C 03/01/2022 1:08 PM

## 2022-04-04 ENCOUNTER — Encounter: Payer: Self-pay | Admitting: Obstetrics

## 2022-04-04 ENCOUNTER — Ambulatory Visit (INDEPENDENT_AMBULATORY_CARE_PROVIDER_SITE_OTHER): Payer: Medicaid Other | Admitting: Obstetrics

## 2022-04-04 VITALS — BP 140/88 | HR 78 | Resp 15 | Ht 65.0 in | Wt 213.2 lb

## 2022-04-04 DIAGNOSIS — Z01419 Encounter for gynecological examination (general) (routine) without abnormal findings: Secondary | ICD-10-CM | POA: Diagnosis not present

## 2022-04-04 DIAGNOSIS — F32A Depression, unspecified: Secondary | ICD-10-CM

## 2022-04-04 MED ORDER — BUPROPION HCL ER (XL) 150 MG PO TB24: 150.0000 mg | ORAL_TABLET | Freq: Every day | ORAL | 2 refills | Status: DC

## 2022-04-04 NOTE — Progress Notes (Signed)
Gynecology Annual Exam   PCP: Burnard Hawthorne, FNP  Chief Complaint:  Chief Complaint  Patient presents with   Annual Exam    History of Present Illness: Patient is a 37 y.o. [phone] presents for annual exam. The patient has no complaints today.  She does report that she stopped taking her Wellbutrin and Buspar. Was not taking it regularly. Had several visits with [facility] discussing medication for mood. Today she shares that she may want to get back on this medication. She tends to get anxious, and took the meds for this reason, although her chart indicates depression on her Problem List. 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  LMP: No LMP recorded. (Menstrual status: IUD). She does not get periods since getting the IUD placed.  The patient is sexually active. She currently uses IUD for contraception. She denies dyspareunia.  The patient does perform self breast exams.  There is no notable family history of breast or ovarian cancer in her family.  The patient wears seatbelts: yes.   The patient has regular exercise: no.    The patient denies current symptoms of depression.    Review of Systems: Review of Systems  Constitutional: Negative.   HENT: Negative.    Eyes: Negative.   Respiratory: Negative.    Cardiovascular: Negative.   Gastrointestinal: Negative.   Genitourinary: Negative.   Musculoskeletal: Negative.   Skin: Negative.   Neurological: Negative.   Endo/Heme/Allergies: Negative.     Past Medical History:  Patient Active Problem List   Diagnosis Date Noted   Depression 09/21/2015   Routine general medical examination at a health care facility 09/21/2015    Past Surgical History:  Past Surgical History:  Procedure Laterality Date   DIAGNOSTIC LAPAROSCOPY  2012   IUD removal in intra-abdominal wall  ([facility])   GALLBLADDER SURGERY      Gynecologic History:  No LMP recorded. (Menstrual status: IUD). Contraception:  IUD Last Pap: Results were: no abnormalities   Obstetric History: [id number]  Family History:  Family History  Problem Relation Age of Onset   Depression Mother    Heart disease Father    Diabetes Father    Diabetes Maternal Aunt    Breast cancer Maternal Aunt 70   Breast cancer Maternal Aunt 28   Colon cancer Maternal Grandfather    Diabetes Paternal Grandmother     Social History:  Social History   Socioeconomic History   Marital status: Married    Spouse name: Not on file   Number of children: Not on file   Years of education: Not on file   Highest education level: Not on file  Occupational History   Not on file  Tobacco Use   Smoking status: Never   Smokeless tobacco: Never  Vaping Use   Vaping Use: Never used  Substance and Sexual Activity   Alcohol use: Yes    Comment: Occasional   Drug use: Never   Sexual activity: Yes    Birth control/protection: I.U.D.    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   Lives [place] with husband and 3 children -11,8,3.      Diet- Has improved.    Exercise- Not right now, not exercising due to time.    Social Determinants of Health   Financial Resource Strain: Not on file  Food Insecurity: Not on file  Transportation  Needs: Not on file  Physical Activity: Not on file  Stress: Not on file  Social Connections: Not on file  Intimate Partner Violence: Not on file    Allergies:  No Known Allergies  Medications: Prior to Admission medications   Medication Sig Start Date End Date Taking? Authorizing Provider  buPROPion (WELLBUTRIN XL) 150 MG 24 hr tablet Take 1 tablet (150 mg total) by mouth daily. 04/04/22  Yes Imagene Riches, CNM  levonorgestrel (MIRENA) 20 MCG/24HR IUD 1 each by Intrauterine route once.    [place]    Physical Exam Vitals: Blood pressure (!) 140/88, pulse 78, resp. rate 15, height 5' 5"$  (1.651 m), weight 213  lb 3.2 oz (96.7 kg).  General: NAD HEENT: normocephalic, anicteric Thyroid: no enlargement, no palpable nodules Pulmonary: No increased work of breathing, CTAB Cardiovascular: RRR, distal pulses 2+ Breast: Breast symmetrical, no tenderness, no palpable nodules or masses, no skin or nipple retraction present, no nipple discharge.  No axillary or supraclavicular lymphadenopathy. Abdomen: NABS, soft, non-tender, non-distended.  Umbilicus without lesions.  No hepatomegaly, splenomegaly or masses palpable. No evidence of hernia  Genitourinary:  External: Normal external female genitalia.  Normal urethral meatus, normal Bartholin's and Skene's glands.    Vagina: Normal vaginal mucosa, no evidence of prolapse.    Cervix: Grossly normal in appearance, no bleeding  Uterus: Non-enlarged, mobile, normal contour.  No CMT  Adnexa: ovaries non-enlarged, no adnexal masses  Rectal: deferred  Lymphatic: no evidence of inguinal lymphadenopathy Extremities: no edema, erythema, or tenderness Neurologic: Grossly intact Psychiatric: mood appropriate, affect full  Female chaperone present for pelvic and breast  portions of the physical exam    Assessment: 37 y.o. [id number] routine annual exam  Plan: Problem List Items Addressed This Visit   None Visit Diagnoses     Anxiety and depression    -  Primary   Relevant Medications   buPROPion (WELLBUTRIN XL) 150 MG 24 hr tablet   Women's annual routine gynecological examination           2) STI screening  wasoffered and declined  2)  ASCCP guidelines and rational discussed.  Patient opts for every 3 years screening interval  3) Contraception - the patient is currently using  IUD.  She is happy with her current form of contraception and plans to continue  4) Routine healthcare maintenance including cholesterol, diabetes screening discussed managed by PCP. She is encouraged to find and establish care with a PCP.  5) No follow-ups on  file.   Additional time was spent discussing her symptoms of anxiety and occasional angry moods. We discussed the Action of SSRIs as they compare with Bupropion. She prefers to return to the Wellbutrin XL. I have prescribed her three months of medication. She will return to the office for medication follow up in 3 months for a medication visit.  Imagene Riches, CNM  04/04/2022 11:51 AM     Imagene Riches, CNM  04/04/2022 11:36 AM   [facility] 04/04/2022, 11:36 AM

## 2022-05-30 ENCOUNTER — Encounter: Payer: Self-pay | Admitting: Family

## 2022-05-30 ENCOUNTER — Ambulatory Visit: Payer: Medicaid Other | Admitting: Family

## 2022-05-30 VITALS — BP 138/90 | HR 85 | Ht 65.0 in | Wt 202.6 lb

## 2022-05-30 DIAGNOSIS — I10 Essential (primary) hypertension: Secondary | ICD-10-CM | POA: Diagnosis not present

## 2022-05-30 DIAGNOSIS — R7303 Prediabetes: Secondary | ICD-10-CM

## 2022-05-30 DIAGNOSIS — R5383 Other fatigue: Secondary | ICD-10-CM

## 2022-05-30 DIAGNOSIS — E538 Deficiency of other specified B group vitamins: Secondary | ICD-10-CM | POA: Diagnosis not present

## 2022-05-30 DIAGNOSIS — E039 Hypothyroidism, unspecified: Secondary | ICD-10-CM

## 2022-05-30 DIAGNOSIS — E782 Mixed hyperlipidemia: Secondary | ICD-10-CM

## 2022-05-30 DIAGNOSIS — G4733 Obstructive sleep apnea (adult) (pediatric): Secondary | ICD-10-CM

## 2022-05-30 DIAGNOSIS — R030 Elevated blood-pressure reading, without diagnosis of hypertension: Secondary | ICD-10-CM

## 2022-05-30 DIAGNOSIS — E559 Vitamin D deficiency, unspecified: Secondary | ICD-10-CM | POA: Diagnosis not present

## 2022-06-01 LAB — CBC WITH DIFFERENTIAL
Basophils Absolute: 0 10*3/uL (ref 0.0–0.2)
Basos: 0 %
EOS (ABSOLUTE): 0.1 10*3/uL (ref 0.0–0.4)
Eos: 1 %
Hematocrit: 44.8 % (ref 34.0–46.6)
Hemoglobin: 15 g/dL (ref 11.1–15.9)
Immature Grans (Abs): 0 10*3/uL (ref 0.0–0.1)
Immature Granulocytes: 1 %
Lymphocytes Absolute: 1.1 10*3/uL (ref 0.7–3.1)
Lymphs: 13 %
MCH: 31.2 pg (ref 26.6–33.0)
MCHC: 33.5 g/dL (ref 31.5–35.7)
MCV: 93 fL (ref 79–97)
Monocytes Absolute: 0.7 10*3/uL (ref 0.1–0.9)
Monocytes: 8 %
Neutrophils Absolute: 6.4 10*3/uL (ref 1.4–7.0)
Neutrophils: 77 %
RBC: 4.81 x10E6/uL (ref 3.77–5.28)
RDW: 12.7 % (ref 11.7–15.4)
WBC: 8.3 10*3/uL (ref 3.4–10.8)

## 2022-06-01 LAB — LIPID PANEL
Chol/HDL Ratio: 2.7 ratio (ref 0.0–4.4)
Cholesterol, Total: 207 mg/dL — ABNORMAL HIGH (ref 100–199)
HDL: 77 mg/dL (ref >39)
LDL Chol Calc (NIH): 106 mg/dL — ABNORMAL HIGH (ref 0–99)
Triglycerides: 138 mg/dL (ref 0–149)
VLDL Cholesterol Cal: 24 mg/dL (ref 5–40)

## 2022-06-01 LAB — IRON,TIBC AND FERRITIN PANEL
Ferritin: 523 ng/mL — ABNORMAL HIGH (ref 15–150)
Iron Saturation: 43 % (ref 15–55)
Iron: 128 ug/dL (ref 27–159)
Total Iron Binding Capacity: 296 ug/dL (ref 250–450)
UIBC: 168 ug/dL (ref 131–425)

## 2022-06-01 LAB — CMP14+EGFR
ALT: 112 IU/L — ABNORMAL HIGH (ref 0–32)
AST: 77 IU/L — ABNORMAL HIGH (ref 0–40)
Albumin/Globulin Ratio: 2 (ref 1.2–2.2)
Albumin: 4.7 g/dL (ref 3.9–4.9)
Alkaline Phosphatase: 66 IU/L (ref 44–121)
BUN/Creatinine Ratio: 9 (ref 9–23)
BUN: 9 mg/dL (ref 6–20)
Bilirubin Total: 0.6 mg/dL (ref 0.0–1.2)
CO2: 21 mmol/L (ref 20–29)
Calcium: 9.8 mg/dL (ref 8.7–10.2)
Chloride: 101 mmol/L (ref 96–106)
Creatinine, Ser: 0.95 mg/dL (ref 0.57–1.00)
Globulin, Total: 2.4 g/dL (ref 1.5–4.5)
Glucose: 126 mg/dL — ABNORMAL HIGH (ref 70–99)
Potassium: 4 mmol/L (ref 3.5–5.2)
Sodium: 143 mmol/L (ref 134–144)
Total Protein: 7.1 g/dL (ref 6.0–8.5)
eGFR: 80 mL/min/{1.73_m2} (ref >59)

## 2022-06-01 LAB — VITAMIN D 25 HYDROXY (VIT D DEFICIENCY, FRACTURES): Vit D, 25-Hydroxy: 31.7 ng/mL (ref 30.0–100.0)

## 2022-06-01 LAB — HEMOGLOBIN A1C
Est. average glucose Bld gHb Est-mCnc: 108 mg/dL
Hgb A1c MFr Bld: 5.4 % (ref 4.8–5.6)

## 2022-06-01 LAB — VITAMIN B12: Vitamin B-12: 617 pg/mL (ref 232–1245)

## 2022-06-01 LAB — TSH: TSH: 2.82 u[IU]/mL (ref 0.450–4.500)

## 2022-06-03 ENCOUNTER — Encounter: Payer: Self-pay | Admitting: Family

## 2022-06-03 DIAGNOSIS — I10 Essential (primary) hypertension: Secondary | ICD-10-CM | POA: Insufficient documentation

## 2022-06-03 DIAGNOSIS — R030 Elevated blood-pressure reading, without diagnosis of hypertension: Secondary | ICD-10-CM | POA: Insufficient documentation

## 2022-06-03 NOTE — Progress Notes (Signed)
New Patient Office Visit  Subjective    Patient ID: Judy Lozano, female    DOB: July 25, 1985  Age: 37 y.o. MRN: [id number]  CC:  Chief Complaint  Patient presents with   Establish Care    NPE    HPI Judy Lozano presents to establish care Previous Primary Care provider/office: was going to OB/GYN for this.  She does not have additional concerns to discuss today.   Patient has had elevated readings of blood pressure, and it is elevated again in office today. ESS is elevated, score was 13.  She wanted to establish care with a new PCP.   No other concerns at this time.     Outpatient Encounter Medications as of 05/30/2022  Medication Sig   levonorgestrel (MIRENA) 20 MCG/24HR IUD 1 each by Intrauterine route once.   buPROPion (WELLBUTRIN XL) 150 MG 24 hr tablet Take 1 tablet (150 mg total) by mouth daily. (Patient not taking: Reported on 05/30/2022)   No facility-administered encounter medications on file as of 05/30/2022.    Past Medical History:  Diagnosis Date   Chronic headaches    Depression    Genital herpes 2012   Left elbow fracture 09/2014   Ovarian cyst     Past Surgical History:  Procedure Laterality Date   DIAGNOSTIC LAPAROSCOPY  2012   IUD removal in intra-abdominal wall  ([facility])   GALLBLADDER SURGERY      Family History  Problem Relation Age of Onset   Depression Mother    Heart disease Father    Diabetes Father    Diabetes Maternal Aunt    Breast cancer Maternal Aunt 32   Breast cancer Maternal Aunt 50   Colon cancer Maternal Grandfather    Diabetes Paternal Grandmother     Social History   Socioeconomic History   Marital status: Married    Spouse name: Gerilyn Pilgrim   Number of children: Not on file   Years of education: Not on file   Highest education level: Not on file  Occupational History   Not on file  Tobacco Use   Smoking status: Never   Smokeless tobacco: Never  Vaping Use   Vaping Use: Never used  Substance and  Sexual Activity   Alcohol use: Yes    Comment: Occasional   Drug use: Never   Sexual activity: Yes    Birth control/protection: I.U.D.    Comment: Mirena  Other Topics Concern   Not on file  Social History Narrative   Works at International aid/development worker clinic.   Lives [place] with husband and 3 children -11,8,3.      Diet- Has improved.    Exercise- Not right now, not exercising due to time.    Social Determinants of Health   Financial Resource Strain: Not on file  Food Insecurity: Not on file  Transportation Needs: Not on file  Physical Activity: Not on file  Stress: Not on file  Social Connections: Not on file  Intimate Partner Violence: Not on file    Review of Systems  All other systems reviewed and are negative.       Objective    BP (!) 138/90   Pulse 85   Ht  (1.651 m)   Wt 202 lb 9.6 oz (91.9 kg)   SpO2 97%   BMI 33.71 kg/m   Physical Exam Vitals and nursing note reviewed.  Constitutional:      Appearance: Normal appearance. She is normal weight.  HENT:  Head: Normocephalic.  Eyes:     Pupils: Pupils are equal, round, and reactive to light.  Cardiovascular:     Rate and Rhythm: Normal rate.  Pulmonary:     Effort: Pulmonary effort is normal.  Neurological:     Mental Status: She is alert.        Assessment & Plan:   Problem List Items Addressed This Visit     Elevated blood-pressure reading, without diagnosis of hypertension   Other Visit Diagnoses     Other fatigue    -  Primary   Relevant Orders   CBC With Differential (Completed)   CMP14+EGFR (Completed)   Iron, TIBC and Ferritin Panel (Completed)   Vitamin D deficiency, unspecified       Relevant Orders   VITAMIN D 25 Hydroxy (Vit-D Deficiency, Fractures) (Completed)   CBC With Differential (Completed)   CMP14+EGFR (Completed)   B12 deficiency due to diet       Relevant Orders   CBC With Differential (Completed)   CMP14+EGFR (Completed)   Vitamin B12 (Completed)   Mixed  hyperlipidemia       Relevant Orders   Lipid panel (Completed)   CBC With Differential (Completed)   CMP14+EGFR (Completed)   Hypothyroidism (acquired)       Relevant Orders   CBC With Differential (Completed)   CMP14+EGFR (Completed)   TSH (Completed)   Essential hypertension, benign       Relevant Orders   CBC With Differential (Completed)   CMP14+EGFR (Completed)   Prediabetes       Relevant Orders   Hemoglobin A1c (Completed)   Obstructive sleep apnea syndrome       Relevant Orders   Ambulatory referral to Sleep Studies       Return in about 2 weeks (around 06/13/2022) for F/U.   Total time spent: 30 minutes  Miki Kins, FNP  05/30/2022

## 2022-06-13 ENCOUNTER — Ambulatory Visit: Payer: Medicaid Other | Admitting: Family

## 2022-06-13 ENCOUNTER — Encounter: Payer: Self-pay | Admitting: Family

## 2022-06-13 VITALS — BP 152/106 | HR 93 | Ht 65.0 in | Wt 191.4 lb

## 2022-06-13 DIAGNOSIS — I10 Essential (primary) hypertension: Secondary | ICD-10-CM | POA: Diagnosis not present

## 2022-06-13 DIAGNOSIS — E669 Obesity, unspecified: Secondary | ICD-10-CM

## 2022-06-13 DIAGNOSIS — Z6831 Body mass index (BMI) 31.0-31.9, adult: Secondary | ICD-10-CM

## 2022-06-13 MED ORDER — PHENTERMINE HCL 37.5 MG PO TABS: 37.5000 mg | ORAL_TABLET | Freq: Every day | ORAL | 0 refills | Status: DC

## 2022-06-13 MED ORDER — LOSARTAN POTASSIUM 25 MG PO TABS: 25.0000 mg | ORAL_TABLET | Freq: Every day | ORAL | 0 refills | Status: DC

## 2022-06-17 ENCOUNTER — Encounter: Payer: Self-pay | Admitting: Family

## 2022-06-17 DIAGNOSIS — E669 Obesity, unspecified: Secondary | ICD-10-CM | POA: Insufficient documentation

## 2022-06-17 DIAGNOSIS — Z6831 Body mass index (BMI) 31.0-31.9, adult: Secondary | ICD-10-CM | POA: Insufficient documentation

## 2022-06-17 NOTE — Assessment & Plan Note (Signed)
Starting patient on losartan.  Will have her take BP at home.   Reassess at follow up.

## 2022-06-17 NOTE — Assessment & Plan Note (Signed)
Patient would like to start phentermine at this point. Educated about the side effects, discussed the need for at least 4 pounds of weight loss prior to follow up.  Will reassess then.

## 2022-06-17 NOTE — Progress Notes (Signed)
Established Patient Office Visit  Subjective:  Patient ID: Judy Lozano, female    DOB: Feb 17, 1985  Age: 37 y.o. MRN: [id number]  Chief Complaint  Patient presents with   Follow-up    2 week follow up    Pt. Here today for 2 week n/p follow up.   Had labs done at that time, so we will review in detail today.  Labs: Her LDL was slightly elevated, but overall her labs looked good.   At her last appointment, we discussed weight loss options, so we will start one of these today.   No other concerns today.      No other concerns at this time.   Past Medical History:  Diagnosis Date   Chronic headaches    Depression    Genital herpes 2012   Left elbow fracture 09/2014   Ovarian cyst     Past Surgical History:  Procedure Laterality Date   DIAGNOSTIC LAPAROSCOPY  2012   IUD removal in intra-abdominal wall  ([facility])   GALLBLADDER SURGERY      Social History   Socioeconomic History   Marital status: Married    Spouse name: Gerilyn Pilgrim   Number of children: Not on file   Years of education: Not on file   Highest education level: Not on file  Occupational History   Not on file  Tobacco Use   Smoking status: Never   Smokeless tobacco: Never  Vaping Use   Vaping Use: Never used  Substance and Sexual Activity   Alcohol use: Yes    Comment: Occasional   Drug use: Never   Sexual activity: Yes    Birth control/protection: I.U.D.    Comment: Mirena  Other Topics Concern   Not on file  Social History Narrative   Works at International aid/development worker clinic.   Lives [place] with husband and 3 children -11,8,3.      Diet- Has improved.    Exercise- Not right now, not exercising due to time.    Social Determinants of Health   Financial Resource Strain: Not on file  Food Insecurity: Not on file  Transportation Needs: Not on file  Physical Activity: Not on file  Stress: Not on file  Social Connections: Not on file  Intimate Partner Violence: Not on file    Family  History  Problem Relation Age of Onset   Depression Mother    Heart disease Father    Diabetes Father    Diabetes Maternal Aunt    Breast cancer Maternal Aunt 2   Breast cancer Maternal Aunt 50   Colon cancer Maternal Grandfather    Diabetes Paternal Grandmother     No Known Allergies  Review of Systems  All other systems reviewed and are negative.      Objective:   BP (!) 152/106   Pulse 93   Ht 5\' 5"  (1.651 m)   Wt 191 lb 6.4 oz (86.8 kg)   SpO2 96%   BMI 31.85 kg/m   Vitals:   06/13/22 0908  BP: (!) 152/106  Pulse: 93  Height: 5\' 5"  (1.651 m)  Weight: 191 lb 6.4 oz (86.8 kg)  SpO2: 96%  BMI (Calculated): 31.85    Physical Exam Vitals and nursing note reviewed.  Constitutional:      Appearance: Normal appearance. She is overweight.  HENT:     Head: Normocephalic.  Eyes:     Pupils: Pupils are equal, round, and reactive to light.  Cardiovascular:     Rate  and Rhythm: Normal rate.  Pulmonary:     Effort: Pulmonary effort is normal.  Neurological:     General: No focal deficit present.     Mental Status: She is alert and oriented to person, place, and time.  Psychiatric:        Mood and Affect: Mood normal.        Behavior: Behavior normal.        Thought Content: Thought content normal.        Judgment: Judgment normal.      No results found for any visits on 06/13/22.  Recent Results (from the past 2160 hour(s))  Lipid panel     Status: Abnormal   Collection Time: 05/30/22 11:27 AM  Result Value Ref Range   Cholesterol, Total 207 (H) 100 - 199 mg/dL   Triglycerides 161 0 - 149 mg/dL   HDL 77 >09 mg/dL   VLDL Cholesterol Cal 24 5 - 40 mg/dL   LDL Chol Calc (NIH) 604 (H) 0 - 99 mg/dL   Chol/HDL Ratio 2.7 0.0 - 4.4 ratio    Comment:                                   T. Chol/HDL Ratio                                             Men  Women                               1/2 Avg.Risk  3.4    3.3                                   Avg.Risk  5.0     4.4                                2X Avg.Risk  9.6    7.1                                3X Avg.Risk 23.4   11.0   VITAMIN D 25 Hydroxy (Vit-D Deficiency, Fractures)     Status: None   Collection Time: 05/30/22 11:27 AM  Result Value Ref Range   Vit D, 25-Hydroxy 31.7 30.0 - 100.0 ng/mL    Comment: Vitamin D deficiency has been defined by the Institute of Medicine and an Endocrine Society practice guideline as a level of serum 25-OH vitamin D less than 20 ng/mL (1,2). The Endocrine Society went on to further define vitamin D insufficiency as a level between 21 and 29 ng/mL (2). 1. IOM (Institute of Medicine). 2010. Dietary reference    intakes for calcium and D. [place]: The    [employer]. 2. Holick MF, Binkley [place], Bischoff-Ferrari HA, et al.    Evaluation, treatment, and prevention of vitamin D    deficiency: an Endocrine Society clinical practice    guideline. JCEM. 2011 Jul; 96(7):1911-30.   CBC With Differential     Status: None   Collection Time: 05/30/22 11:27 AM  Result Value Ref Range   WBC 8.3 3.4 - 10.8 x10E3/uL   RBC 4.81 3.77 - 5.28 x10E6/uL   Hemoglobin 15.0 11.1 - 15.9 g/dL   Hematocrit 16.1 09.6 - 46.6 %   MCV 93 79 - 97 fL   MCH 31.2 26.6 - 33.0 pg   MCHC 33.5 31.5 - 35.7 g/dL   RDW 04.5 40.9 - 81.1 %   Neutrophils 77 Not Estab. %   Lymphs 13 Not Estab. %   Monocytes 8 Not Estab. %   Eos 1 Not Estab. %   Basos 0 Not Estab. %   Neutrophils Absolute 6.4 1.4 - 7.0 x10E3/uL   Lymphocytes Absolute 1.1 0.7 - 3.1 x10E3/uL   Monocytes Absolute 0.7 0.1 - 0.9 x10E3/uL   EOS (ABSOLUTE) 0.1 0.0 - 0.4 x10E3/uL   Basophils Absolute 0.0 0.0 - 0.2 x10E3/uL   Immature Granulocytes 1 Not Estab. %   Immature Grans (Abs) 0.0 0.0 - 0.1 x10E3/uL  CMP14+EGFR     Status: Abnormal   Collection Time: 05/30/22 11:27 AM  Result Value Ref Range   Glucose 126 (H) 70 - 99 mg/dL   BUN 9 6 - 20 mg/dL   Creatinine, Ser 9.14 0.57 - 1.00 mg/dL   eGFR 80 >78  GN/FAO/1.30   BUN/Creatinine Ratio 9 9 - 23   Sodium 143 134 - 144 mmol/L   Potassium 4.0 3.5 - 5.2 mmol/L   Chloride 101 96 - 106 mmol/L   CO2 21 20 - 29 mmol/L   Calcium 9.8 8.7 - 10.2 mg/dL   Total Protein 7.1 6.0 - 8.5 g/dL   Albumin 4.7 3.9 - 4.9 g/dL   Globulin, Total 2.4 1.5 - 4.5 g/dL   Albumin/Globulin Ratio 2.0 1.2 - 2.2   Bilirubin Total 0.6 0.0 - 1.2 mg/dL   Alkaline Phosphatase 66 44 - 121 IU/L   AST 77 (H) 0 - 40 IU/L   ALT 112 (H) 0 - 32 IU/L  TSH     Status: None   Collection Time: 05/30/22 11:27 AM  Result Value Ref Range   TSH 2.820 0.450 - 4.500 uIU/mL  Hemoglobin A1c     Status: None   Collection Time: 05/30/22 11:27 AM  Result Value Ref Range   Hgb A1c MFr Bld 5.4 4.8 - 5.6 %    Comment:          Prediabetes: 5.7 - 6.4          Diabetes: >6.4          Glycemic control for adults with diabetes: <7.0    Est. average glucose Bld gHb Est-mCnc 108 mg/dL  Vitamin Q65     Status: None   Collection Time: 05/30/22 11:27 AM  Result Value Ref Range   Vitamin B-12 617 232 - 1,245 pg/mL  Iron, TIBC and Ferritin Panel     Status: Abnormal   Collection Time: 05/30/22 11:27 AM  Result Value Ref Range   Total Iron Binding Capacity 296 250 - 450 ug/dL   UIBC 784 696 - 295 ug/dL   Iron 284 27 - 132 ug/dL   Iron Saturation 43 15 - 55 %   Ferritin 523 (H) 15 - 150 ng/mL       Assessment & Plan:   Problem List Items Addressed This Visit       Cardiovascular and Mediastinum   Hypertension - Primary    Starting patient on losartan.  Will have her take BP at home.   Reassess  at follow up.      Relevant Medications   losartan (COZAAR) 25 MG tablet     Other   RESOLVED: BMI 31.0-31.9,adult   Obesity (BMI 30.0-34.9)    Patient would like to start phentermine at this point. Educated about the side effects, discussed the need for at least 4 pounds of weight loss prior to follow up.  Will reassess then.       Return in about 1 month (around 07/14/2022) for  F/U.   Total time spent: 20 minutes  Miki Kins, FNP  06/13/2022

## 2022-07-18 ENCOUNTER — Encounter: Payer: Self-pay | Admitting: Family

## 2022-07-18 ENCOUNTER — Ambulatory Visit: Payer: Medicaid Other | Admitting: Family

## 2022-07-18 VITALS — BP 122/68 | HR 95 | Ht 65.0 in | Wt 184.8 lb

## 2022-07-18 DIAGNOSIS — E669 Obesity, unspecified: Secondary | ICD-10-CM | POA: Diagnosis not present

## 2022-07-18 DIAGNOSIS — E66811 Obesity, class 1: Secondary | ICD-10-CM

## 2022-07-18 DIAGNOSIS — I10 Essential (primary) hypertension: Secondary | ICD-10-CM

## 2022-07-18 MED ORDER — LOSARTAN POTASSIUM 25 MG PO TABS: 25.0000 mg | ORAL_TABLET | Freq: Every day | ORAL | 1 refills | Status: DC

## 2022-07-18 MED ORDER — PHENTERMINE HCL 37.5 MG PO TABS: 37.5000 mg | ORAL_TABLET | Freq: Every day | ORAL | 0 refills | Status: DC

## 2022-07-18 NOTE — Assessment & Plan Note (Signed)
Blood pressure well controlled with current medications.  Continue current therapy.  Will reassess at follow up.  

## 2022-07-18 NOTE — Assessment & Plan Note (Signed)
The patient has med the threshold to continue phentermine therapy.  she has been reminded of the weight loss requirements to continue therapy.  she verbalized understanding and agreement with plan of care.  Continue current meds.  Will adjust as needed based on results.  The patient is asked to make an attempt to improve diet and exercise patterns to aid in medical management of this problem. Addressed importance of increasing and maintaining water intake.   

## 2022-07-18 NOTE — Progress Notes (Signed)
Established Patient Office Visit  Subjective:  Patient ID: Judy Lozano, female    DOB: 09/04/1985  Age: 37 y.o. MRN: [id number]  Chief Complaint  Patient presents with   Follow-up    1 month follow up    Patient is here for her 1 month follow up.   Blood pressure is much better than previous readings today.  Patient has been diligent about taking the meds every morning.   She has lost >4 pounds in the last month and meets the requirements for continuing phentermine therapy.  She denies any side effects.     No other concerns at this time.   Past Medical History:  Diagnosis Date   Chronic headaches    Depression    Genital herpes 2012   Left elbow fracture 09/2014   Ovarian cyst     Past Surgical History:  Procedure Laterality Date   DIAGNOSTIC LAPAROSCOPY  2012   IUD removal in intra-abdominal wall  ([facility])   GALLBLADDER SURGERY      Social History   Socioeconomic History   Marital status: Married    Spouse name: Gerilyn Pilgrim   Number of children: Not on file   Years of education: Not on file   Highest education level: Not on file  Occupational History   Not on file  Tobacco Use   Smoking status: Never   Smokeless tobacco: Never  Vaping Use   Vaping Use: Never used  Substance and Sexual Activity   Alcohol use: Yes    Comment: Occasional   Drug use: Never   Sexual activity: Yes    Birth control/protection: I.U.D.    Comment: Mirena  Other Topics Concern   Not on file  Social History Narrative   Works at International aid/development worker clinic.   Lives [place] with husband and 3 children -11,8,3.      Diet- Has improved.    Exercise- Not right now, not exercising due to time.    Social Determinants of Health   Financial Resource Strain: Not on file  Food Insecurity: Not on file  Transportation Needs: Not on file  Physical Activity: Not on file  Stress: Not on file  Social Connections: Not on file  Intimate Partner Violence: Not on file    Family  History  Problem Relation Age of Onset   Depression Mother    Heart disease Father    Diabetes Father    Diabetes Maternal Aunt    Breast cancer Maternal Aunt 74   Breast cancer Maternal Aunt 50   Colon cancer Maternal Grandfather    Diabetes Paternal Grandmother     No Known Allergies  Review of Systems  All other systems reviewed and are negative.      Objective:   BP 122/68   Pulse 95   Ht 5\' 5"  (1.651 m)   Wt 184 lb 12.8 oz (83.8 kg)   SpO2 98%   BMI 30.75 kg/m   Vitals:   07/18/22 0924  BP: 122/68  Pulse: 95  Height: 5\' 5"  (1.651 m)  Weight: 184 lb 12.8 oz (83.8 kg)  SpO2: 98%  BMI (Calculated): 30.75    Physical Exam Vitals and nursing note reviewed.  Constitutional:      Appearance: Normal appearance. She is normal weight.  HENT:     Head: Normocephalic.  Eyes:     Pupils: Pupils are equal, round, and reactive to light.  Cardiovascular:     Rate and Rhythm: Normal rate.  Pulmonary:  Effort: Pulmonary effort is normal.  Neurological:     Mental Status: She is alert.      No results found for any visits on 07/18/22.  Recent Results (from the past 2160 hour(s))  Lipid panel     Status: Abnormal   Collection Time: 05/30/22 11:27 AM  Result Value Ref Range   Cholesterol, Total 207 (H) 100 - 199 mg/dL   Triglycerides 161 0 - 149 mg/dL   HDL 77 >09 mg/dL   VLDL Cholesterol Cal 24 5 - 40 mg/dL   LDL Chol Calc (NIH) 604 (H) 0 - 99 mg/dL   Chol/HDL Ratio 2.7 0.0 - 4.4 ratio    Comment:                                   T. Chol/HDL Ratio                                             Men  Women                               1/2 Avg.Risk  3.4    3.3                                   Avg.Risk  5.0    4.4                                2X Avg.Risk  9.6    7.1                                3X Avg.Risk 23.4   11.0   VITAMIN D 25 Hydroxy (Vit-D Deficiency, Fractures)     Status: None   Collection Time: 05/30/22 11:27 AM  Result Value Ref Range    Vit D, 25-Hydroxy 31.7 30.0 - 100.0 ng/mL    Comment: Vitamin D deficiency has been defined by the Institute of Medicine and an Endocrine Society practice guideline as a level of serum 25-OH vitamin D less than 20 ng/mL (1,2). The Endocrine Society went on to further define vitamin D insufficiency as a level between 21 and 29 ng/mL (2). 1. IOM (Institute of Medicine). 2010. Dietary reference    intakes for calcium and D. [place]: The    [employer]. 2. Holick MF, Binkley [place], Bischoff-Ferrari HA, et al.    Evaluation, treatment, and prevention of vitamin D    deficiency: an Endocrine Society clinical practice    guideline. JCEM. 2011 Jul; 96(7):1911-30.   CBC With Differential     Status: None   Collection Time: 05/30/22 11:27 AM  Result Value Ref Range   WBC 8.3 3.4 - 10.8 x10E3/uL   RBC 4.81 3.77 - 5.28 x10E6/uL   Hemoglobin 15.0 11.1 - 15.9 g/dL   Hematocrit 54.0 98.1 - 46.6 %   MCV 93 79 - 97 fL   MCH 31.2 26.6 - 33.0 pg   MCHC 33.5 31.5 - 35.7 g/dL   RDW 19.1 47.8 - 29.5 %   Neutrophils 77  Not Estab. %   Lymphs 13 Not Estab. %   Monocytes 8 Not Estab. %   Eos 1 Not Estab. %   Basos 0 Not Estab. %   Neutrophils Absolute 6.4 1.4 - 7.0 x10E3/uL   Lymphocytes Absolute 1.1 0.7 - 3.1 x10E3/uL   Monocytes Absolute 0.7 0.1 - 0.9 x10E3/uL   EOS (ABSOLUTE) 0.1 0.0 - 0.4 x10E3/uL   Basophils Absolute 0.0 0.0 - 0.2 x10E3/uL   Immature Granulocytes 1 Not Estab. %   Immature Grans (Abs) 0.0 0.0 - 0.1 x10E3/uL  CMP14+EGFR     Status: Abnormal   Collection Time: 05/30/22 11:27 AM  Result Value Ref Range   Glucose 126 (H) 70 - 99 mg/dL   BUN 9 6 - 20 mg/dL   Creatinine, Ser 7.84 0.57 - 1.00 mg/dL   eGFR 80 >69 GE/XBM/8.41   BUN/Creatinine Ratio 9 9 - 23   Sodium 143 134 - 144 mmol/L   Potassium 4.0 3.5 - 5.2 mmol/L   Chloride 101 96 - 106 mmol/L   CO2 21 20 - 29 mmol/L   Calcium 9.8 8.7 - 10.2 mg/dL   Total Protein 7.1 6.0 - 8.5 g/dL   Albumin 4.7 3.9 - 4.9  g/dL   Globulin, Total 2.4 1.5 - 4.5 g/dL   Albumin/Globulin Ratio 2.0 1.2 - 2.2   Bilirubin Total 0.6 0.0 - 1.2 mg/dL   Alkaline Phosphatase 66 44 - 121 IU/L   AST 77 (H) 0 - 40 IU/L   ALT 112 (H) 0 - 32 IU/L  TSH     Status: None   Collection Time: 05/30/22 11:27 AM  Result Value Ref Range   TSH 2.820 0.450 - 4.500 uIU/mL  Hemoglobin A1c     Status: None   Collection Time: 05/30/22 11:27 AM  Result Value Ref Range   Hgb A1c MFr Bld 5.4 4.8 - 5.6 %    Comment:          Prediabetes: 5.7 - 6.4          Diabetes: >6.4          Glycemic control for adults with diabetes: <7.0    Est. average glucose Bld gHb Est-mCnc 108 mg/dL  Vitamin L24     Status: None   Collection Time: 05/30/22 11:27 AM  Result Value Ref Range   Vitamin B-12 617 232 - 1,245 pg/mL  Iron, TIBC and Ferritin Panel     Status: Abnormal   Collection Time: 05/30/22 11:27 AM  Result Value Ref Range   Total Iron Binding Capacity 296 250 - 450 ug/dL   UIBC 401 027 - 253 ug/dL   Iron 664 27 - 403 ug/dL   Iron Saturation 43 15 - 55 %   Ferritin 523 (H) 15 - 150 ng/mL       Assessment & Plan:   Problem List Items Addressed This Visit       Active Problems   Hypertension - Primary    Blood pressure well controlled with current medications.  Continue current therapy.  Will reassess at follow up.        Obesity (BMI 30.0-34.9)    The patient has med the threshold to continue phentermine therapy.  she has been reminded of the weight loss requirements to continue therapy.  she verbalized understanding and agreement with plan of care.   Continue current meds.  Will adjust as needed based on results.  The patient is asked to make an attempt to improve diet  and exercise patterns to aid in medical management of this problem. Addressed importance of increasing and maintaining water intake.         Return in about 1 month (around 08/17/2022) for F/U.   Total time spent: 20 minutes  Miki Kins,  FNP  07/18/2022   This document may have been prepared by [facility] Voice Recognition software and as such may include unintentional dictation errors.

## 2022-07-26 ENCOUNTER — Encounter: Payer: Self-pay | Admitting: Family

## 2022-08-15 ENCOUNTER — Ambulatory Visit: Payer: Medicaid Other | Admitting: Family

## 2022-11-14 ENCOUNTER — Ambulatory Visit: Payer: Medicaid Other | Admitting: Family

## 2022-11-14 VITALS — BP 120/80 | HR 68 | Ht 65.0 in | Wt 186.4 lb

## 2022-11-14 DIAGNOSIS — E66811 Obesity, class 1: Secondary | ICD-10-CM | POA: Diagnosis not present

## 2022-11-14 DIAGNOSIS — I10 Essential (primary) hypertension: Secondary | ICD-10-CM

## 2022-11-14 MED ORDER — LOSARTAN POTASSIUM 25 MG PO TABS: 25.0000 mg | ORAL_TABLET | Freq: Every day | ORAL | 1 refills | Status: DC

## 2022-11-14 MED ORDER — PHENTERMINE HCL 37.5 MG PO TABS: 37.5000 mg | ORAL_TABLET | Freq: Every day | ORAL | 2 refills | Status: DC

## 2022-11-14 NOTE — Progress Notes (Signed)
Established Patient Office Visit  Subjective:  Patient ID: Judy Lozano, female    DOB: [birth year]  Age: 37 y.o. MRN: [id number]  Chief Complaint  Patient presents with   Follow-up    F/u    Patient is here today for her follow up.  She has been feeling well since last appointment.   She does have additional concerns to discuss today.  Labs are not due today. She needs refills.   I have reviewed her active problem list, medication list, allergies, notes from last encounter, lab results for her appointment today.      No other concerns at this time.   Past Medical History:  Diagnosis Date   Chronic headaches    Depression    Genital herpes 2012   Left elbow fracture 09/2014   Ovarian cyst     Past Surgical History:  Procedure Laterality Date   DIAGNOSTIC LAPAROSCOPY  2012   IUD removal in intra-abdominal wall  ([facility])   GALLBLADDER SURGERY      Social History   Socioeconomic History   Marital status: Married    Spouse name: Gerilyn Pilgrim   Number of children: Not on file   Years of education: Not on file   Highest education level: Not on file  Occupational History   Not on file  Tobacco Use   Smoking status: Never   Smokeless tobacco: Never  Vaping Use   Vaping status: Never Used  Substance and Sexual Activity   Alcohol use: Yes    Comment: Occasional   Drug use: Never   Sexual activity: Yes    Birth control/protection: I.U.D.    Comment: Mirena  Other Topics Concern   Not on file  Social History Narrative   Works at International aid/development worker clinic.   Lives [place] with husband and 3 children -11,8,3.      Diet- Has improved.    Exercise- Not right now, not exercising due to time.    Social Determinants of Health   Financial Resource Strain: Not on file  Food Insecurity: Not on file  Transportation Needs: Not on file  Physical Activity: Not on file  Stress: Not on file  Social Connections: Not on file  Intimate Partner Violence: Not on file     Family History  Problem Relation Age of Onset   Depression Mother    Heart disease Father    Diabetes Father    Diabetes Maternal Aunt    Breast cancer Maternal Aunt 43   Breast cancer Maternal Aunt 50   Colon cancer Maternal Grandfather    Diabetes Paternal Grandmother     No Known Allergies  Review of Systems  All other systems reviewed and are negative.      Objective:   BP 120/80   Pulse 68   Ht 5\' 5"  (1.651 m)   Wt 186 lb 6.4 oz (84.6 kg)   SpO2 97%   BMI 31.02 kg/m   Vitals:   11/14/22 1045  BP: 120/80  Pulse: 68  Height: 5\' 5"  (1.651 m)  Weight: 186 lb 6.4 oz (84.6 kg)  SpO2: 97%  BMI (Calculated): 31.02    Physical Exam Vitals and nursing note reviewed.  Constitutional:      Appearance: Normal appearance. She is normal weight.  HENT:     Head: Normocephalic.  Eyes:     Extraocular Movements: Extraocular movements intact.     Conjunctiva/sclera: Conjunctivae normal.     Pupils: Pupils are equal, round, and reactive to  light.  Cardiovascular:     Rate and Rhythm: Normal rate.  Pulmonary:     Effort: Pulmonary effort is normal.  Neurological:     General: No focal deficit present.     Mental Status: She is alert and oriented to person, place, and time. Mental status is at baseline.  Psychiatric:        Mood and Affect: Mood normal.        Behavior: Behavior normal.        Thought Content: Thought content normal.      No results found for any visits on 11/14/22.  No results found for this or any previous visit (from the past 2160 hour(s)).     Assessment & Plan:   Problem List Items Addressed This Visit       Active Problems   Hypertension    Blood pressure well controlled with current medications.  Continue current therapy.  Will reassess at follow up.       Relevant Medications   losartan (COZAAR) 25 MG tablet   Obesity (BMI 30.0-34.9) - Primary    Continue current meds.  Will adjust as needed based on results.  The  patient is asked to make an attempt to improve diet and exercise patterns to aid in medical management of this problem. Addressed importance of increasing and maintaining water intake.        Return in about 2 months (around 01/14/2023).   Total time spent: 20 minutes  Miki Kins, FNP  11/14/2022   This document may have been prepared by [facility] Voice Recognition software and as such may include unintentional dictation errors.

## 2022-12-23 ENCOUNTER — Encounter: Payer: Self-pay | Admitting: Family

## 2022-12-23 NOTE — Assessment & Plan Note (Signed)
Continue current meds.  Will adjust as needed based on results.  The patient is asked to make an attempt to improve diet and exercise patterns to aid in medical management of this problem. Addressed importance of increasing and maintaining water intake.   

## 2022-12-23 NOTE — Assessment & Plan Note (Signed)
Blood pressure well controlled with current medications.  Continue current therapy.  Will reassess at follow up.  

## 2023-01-16 ENCOUNTER — Encounter: Payer: Self-pay | Admitting: Family

## 2023-01-16 ENCOUNTER — Ambulatory Visit: Payer: Medicaid Other | Admitting: Family

## 2023-01-16 VITALS — BP 142/96 | HR 80 | Ht 65.0 in | Wt 186.4 lb

## 2023-01-16 DIAGNOSIS — E66811 Obesity, class 1: Secondary | ICD-10-CM

## 2023-01-16 MED ORDER — SEMAGLUTIDE-WEIGHT MANAGEMENT 2.4 MG/0.75ML ~~LOC~~ SOAJ: 2.4000 mg | SUBCUTANEOUS | 1 refills | Status: AC

## 2023-01-16 MED ORDER — SEMAGLUTIDE-WEIGHT MANAGEMENT 1 MG/0.5ML ~~LOC~~ SOAJ: 1.0000 mg | SUBCUTANEOUS | 0 refills | Status: AC

## 2023-01-16 MED ORDER — SEMAGLUTIDE-WEIGHT MANAGEMENT 0.5 MG/0.5ML ~~LOC~~ SOAJ: 0.5000 mg | SUBCUTANEOUS | 0 refills | Status: AC

## 2023-01-16 MED ORDER — SEMAGLUTIDE-WEIGHT MANAGEMENT 0.25 MG/0.5ML ~~LOC~~ SOAJ: 0.2500 mg | SUBCUTANEOUS | Status: AC

## 2023-01-16 MED ORDER — SEMAGLUTIDE-WEIGHT MANAGEMENT 1.7 MG/0.75ML ~~LOC~~ SOAJ: 1.7000 mg | SUBCUTANEOUS | 0 refills | Status: AC

## 2023-01-16 NOTE — Assessment & Plan Note (Signed)
Starting VWUJWJ. Sent RX for higher doses.  Will call pt once we have samples available for the low dose.   Continue current meds until these are available.

## 2023-01-16 NOTE — Progress Notes (Signed)
Established Patient Office Visit  Subjective:  Patient ID: Judy Lozano, female    DOB: 1985/12/25  Age: 37 y.o. MRN: [id number]  Chief Complaint  Patient presents with   Follow-up    2 month follow up    Patient is here today for her 2 months follow up.  She has been feeling fairly well since last appointment.   She does have additional concerns to discuss today.  She has not continued to lose any weight with the phentermine.  She was not ever able to get the wegovy.   Labs are due today. She needs refills.   I have reviewed her active problem list, medication list, allergies, notes from last encounter, lab results for her appointment today.      No other concerns at this time.   Past Medical History:  Diagnosis Date   Chronic headaches    Depression    Genital herpes 2012   Left elbow fracture 09/2014   Ovarian cyst     Past Surgical History:  Procedure Laterality Date   DIAGNOSTIC LAPAROSCOPY  2012   IUD removal in intra-abdominal wall  ([facility])   GALLBLADDER SURGERY      Social History   Socioeconomic History   Marital status: Married    Spouse name: Gerilyn Pilgrim   Number of children: Not on file   Years of education: Not on file   Highest education level: Not on file  Occupational History   Not on file  Tobacco Use   Smoking status: Never   Smokeless tobacco: Never  Vaping Use   Vaping status: Never Used  Substance and Sexual Activity   Alcohol use: Yes    Comment: Occasional   Drug use: Never   Sexual activity: Yes    Birth control/protection: I.U.D.    Comment: Mirena  Other Topics Concern   Not on file  Social History Narrative   Works at International aid/development worker clinic.   Lives [place] with husband and 3 children -11,8,3.      Diet- Has improved.    Exercise- Not right now, not exercising due to time.    Social Determinants of Health   Financial Resource Strain: Not on file  Food Insecurity: Not on file  Transportation Needs: Not on  file  Physical Activity: Not on file  Stress: Not on file  Social Connections: Not on file  Intimate Partner Violence: Not on file    Family History  Problem Relation Age of Onset   Depression Mother    Heart disease Father    Diabetes Father    Diabetes Maternal Aunt    Breast cancer Maternal Aunt 56   Breast cancer Maternal Aunt 50   Colon cancer Maternal Grandfather    Diabetes Paternal Grandmother     No Known Allergies  Review of Systems  All other systems reviewed and are negative.      Objective:   BP (!) 142/96   Pulse 80   Ht 5\' 5"  (1.651 m)   Wt 186 lb 6.4 oz (84.6 kg)   SpO2 97%   BMI 31.02 kg/m   Vitals:   01/16/23 0955  BP: (!) 142/96  Pulse: 80  Height: 5\' 5"  (1.651 m)  Weight: 186 lb 6.4 oz (84.6 kg)  SpO2: 97%  BMI (Calculated): 31.02    Physical Exam Vitals and nursing note reviewed.  Constitutional:      Appearance: Normal appearance. She is obese.  HENT:     Head: Normocephalic.  Eyes:     Extraocular Movements: Extraocular movements intact.     Conjunctiva/sclera: Conjunctivae normal.     Pupils: Pupils are equal, round, and reactive to light.  Cardiovascular:     Rate and Rhythm: Normal rate.  Pulmonary:     Effort: Pulmonary effort is normal.  Neurological:     General: No focal deficit present.     Mental Status: She is alert and oriented to person, place, and time. Mental status is at baseline.  Psychiatric:        Mood and Affect: Mood normal.        Behavior: Behavior normal.        Thought Content: Thought content normal.        Judgment: Judgment normal.      No results found for any visits on 01/16/23.  No results found for this or any previous visit (from the past 2160 hour(s)).     Assessment & Plan:   Problem List Items Addressed This Visit       Active Problems   Obesity (BMI 30.0-34.9) - Primary    Starting MJ.Hock. Sent RX for higher doses.  Will call pt once we have samples available for the low  dose.   Continue current meds until these are available.       Return in about 1 month (around 02/16/2023) for F/U.   Total time spent: 20 minutes  Miki Kins, FNP  01/16/2023   This document may have been prepared by [facility] Voice Recognition software and as such may include unintentional dictation errors.

## 2023-02-20 ENCOUNTER — Ambulatory Visit: Payer: Medicaid Other | Admitting: Family

## 2023-02-27 ENCOUNTER — Ambulatory Visit: Payer: BC Managed Care – PPO | Admitting: Family

## 2023-02-27 ENCOUNTER — Encounter: Payer: Self-pay | Admitting: Family

## 2023-02-27 VITALS — BP 140/90 | HR 73 | Ht 65.0 in | Wt 184.0 lb

## 2023-02-27 DIAGNOSIS — E66811 Obesity, class 1: Secondary | ICD-10-CM | POA: Diagnosis not present

## 2023-02-27 DIAGNOSIS — I10 Essential (primary) hypertension: Secondary | ICD-10-CM | POA: Diagnosis not present

## 2023-02-27 NOTE — Assessment & Plan Note (Signed)
 Continue Wegovy .  Patient instructed to call pharmacy to get next dose.   She will let me know if she has any side effects.   Reassess at follow up.

## 2023-02-27 NOTE — Progress Notes (Signed)
 Established Patient Office Visit  Subjective:  Patient ID: Judy Lozano, female    DOB: [birth year]  Age: 38 y.o. MRN: [id number]  Chief Complaint  Patient presents with   Follow-up    1 month follow up    Patient is here today for her 1 month follow up.  She has been feeling fairly well since last appointment.   Blood pressure is still elevated today, patient says she was hoping her BP would improve without meds if she stopped the phentermine .  Also reports that she   I have reviewed her active problem list, medication list, allergies, notes from last encounter, lab results for her appointment today.      No other concerns at this time.   Past Medical History:  Diagnosis Date   Chronic headaches    Depression    Genital herpes 2012   Left elbow fracture 09/2014   Ovarian cyst     Past Surgical History:  Procedure Laterality Date   DIAGNOSTIC LAPAROSCOPY  2012   IUD removal in intra-abdominal wall  ([facility])   GALLBLADDER SURGERY      Social History   Socioeconomic History   Marital status: Married    Spouse name: Derwood Flor   Number of children: Not on file   Years of education: Not on file   Highest education level: Not on file  Occupational History   Not on file  Tobacco Use   Smoking status: Never   Smokeless tobacco: Never  Vaping Use   Vaping status: Never Used  Substance and Sexual Activity   Alcohol use: Yes    Comment: Occasional   Drug use: Never   Sexual activity: Yes    Birth control/protection: I.U.D.    Comment: Mirena   Other Topics Concern   Not on file  Social History Narrative   Works at International aid/development worker clinic.   Lives [place] with husband and 3 children -11,8,3.      Diet- Has improved.    Exercise- Not right now, not exercising due to time.    Social Drivers of Corporate investment banker Strain: Not on file  Food Insecurity: Not on file  Transportation Needs: Not on file  Physical Activity: Not on file  Stress: Not on  file  Social Connections: Not on file  Intimate Partner Violence: Not on file    Family History  Problem Relation Age of Onset   Depression Mother    Heart disease Father    Diabetes Father    Diabetes Maternal Aunt    Breast cancer Maternal Aunt 66   Breast cancer Maternal Aunt 50   Colon cancer Maternal Grandfather    Diabetes Paternal Grandmother     No Known Allergies  Review of Systems  All other systems reviewed and are negative.      Objective:   BP (!) 140/90   Pulse 73   Ht 5\' 5"  (1.651 m)   Wt 184 lb (83.5 kg)   SpO2 98%   BMI 30.62 kg/m   Vitals:   02/27/23 0852  BP: (!) 140/90  Pulse: 73  Height: 5\' 5"  (1.651 m)  Weight: 184 lb (83.5 kg)  SpO2: 98%  BMI (Calculated): 30.62    Physical Exam Vitals and nursing note reviewed.  Constitutional:      Appearance: Normal appearance. She is normal weight.  HENT:     Head: Normocephalic.  Eyes:     Extraocular Movements: Extraocular movements intact.     Conjunctiva/sclera:  Conjunctivae normal.     Pupils: Pupils are equal, round, and reactive to light.  Cardiovascular:     Rate and Rhythm: Normal rate.  Pulmonary:     Effort: Pulmonary effort is normal.  Neurological:     General: No focal deficit present.     Mental Status: She is alert and oriented to person, place, and time. Mental status is at baseline.  Psychiatric:        Mood and Affect: Mood normal.        Behavior: Behavior normal.        Thought Content: Thought content normal.        Judgment: Judgment normal.      No results found for any visits on 02/27/23.  No results found for this or any previous visit (from the past 2160 hours).     Assessment & Plan:   Problem List Items Addressed This Visit       Cardiovascular and Mediastinum   Hypertension   Continue BP meds for now.  Will re-evaluate discontinuing at her follow up.         Other   Obesity (BMI 30.0-34.9) - Primary   Continue Wegovy .  Patient instructed  to call pharmacy to get next dose.   She will let me know if she has any side effects.   Reassess at follow up.       Return in about 2 months (around 04/27/2023).   Total time spent: 20 minutes  Trenda Frisk, FNP  02/27/2023   This document may have been prepared by [facility] Voice Recognition software and as such may include unintentional dictation errors.

## 2023-02-27 NOTE — Assessment & Plan Note (Signed)
 Continue BP meds for now.  Will re-evaluate discontinuing at her follow up.

## 2023-05-01 ENCOUNTER — Ambulatory Visit: Payer: Medicaid Other | Admitting: Family

## 2023-09-26 ENCOUNTER — Other Ambulatory Visit: Payer: Self-pay | Admitting: Family
